# Patient Record
Sex: Female | Born: 1945 | Race: White | Hispanic: No | Smoking: Former smoker
Health system: Southern US, Community
[De-identification: ages and names within clinical notes are randomized; demographics above are authoritative.]

## PROBLEM LIST (undated history)

## (undated) DIAGNOSIS — D649 Anemia, unspecified: Secondary | ICD-10-CM

## (undated) DIAGNOSIS — G4730 Sleep apnea, unspecified: Secondary | ICD-10-CM

## (undated) DIAGNOSIS — I499 Cardiac arrhythmia, unspecified: Secondary | ICD-10-CM

## (undated) DIAGNOSIS — G5620 Lesion of ulnar nerve, unspecified upper limb: Secondary | ICD-10-CM

## (undated) DIAGNOSIS — Z923 Personal history of irradiation: Secondary | ICD-10-CM

## (undated) DIAGNOSIS — R928 Other abnormal and inconclusive findings on diagnostic imaging of breast: Secondary | ICD-10-CM

## (undated) DIAGNOSIS — J449 Chronic obstructive pulmonary disease, unspecified: Secondary | ICD-10-CM

## (undated) DIAGNOSIS — F329 Major depressive disorder, single episode, unspecified: Secondary | ICD-10-CM

## (undated) DIAGNOSIS — E538 Deficiency of other specified B group vitamins: Secondary | ICD-10-CM

## (undated) DIAGNOSIS — C50919 Malignant neoplasm of unspecified site of unspecified female breast: Secondary | ICD-10-CM

## (undated) DIAGNOSIS — M545 Low back pain: Secondary | ICD-10-CM

## (undated) DIAGNOSIS — G8929 Other chronic pain: Secondary | ICD-10-CM

## (undated) DIAGNOSIS — G4700 Insomnia, unspecified: Secondary | ICD-10-CM

## (undated) DIAGNOSIS — E669 Obesity, unspecified: Secondary | ICD-10-CM

## (undated) DIAGNOSIS — I10 Essential (primary) hypertension: Secondary | ICD-10-CM

## (undated) DIAGNOSIS — E785 Hyperlipidemia, unspecified: Secondary | ICD-10-CM

## (undated) DIAGNOSIS — K219 Gastro-esophageal reflux disease without esophagitis: Secondary | ICD-10-CM

## (undated) DIAGNOSIS — K8020 Calculus of gallbladder without cholecystitis without obstruction: Secondary | ICD-10-CM

## (undated) DIAGNOSIS — E119 Type 2 diabetes mellitus without complications: Secondary | ICD-10-CM

## (undated) HISTORY — PX: BACK SURGERY: SHX140

## (undated) HISTORY — DX: Other chronic pain: G89.29

## (undated) HISTORY — DX: Major depressive disorder, single episode, unspecified: F32.9

## (undated) HISTORY — DX: Obesity, unspecified: E66.9

## (undated) HISTORY — DX: Deficiency of other specified B group vitamins: E53.8

## (undated) HISTORY — DX: Insomnia, unspecified: G47.00

## (undated) HISTORY — DX: Type 2 diabetes mellitus without complications: E11.9

## (undated) HISTORY — PX: ULNAR NERVE REPAIR: SHX2594

## (undated) HISTORY — PX: ABDOMINAL HYSTERECTOMY: SHX81

## (undated) HISTORY — PX: CARPAL TUNNEL RELEASE: SHX101

## (undated) HISTORY — PX: NECK SURGERY: SHX720

## (undated) HISTORY — DX: Other abnormal and inconclusive findings on diagnostic imaging of breast: R92.8

## (undated) HISTORY — DX: Essential (primary) hypertension: I10

## (undated) HISTORY — DX: Gastro-esophageal reflux disease without esophagitis: K21.9

## (undated) HISTORY — DX: Calculus of gallbladder without cholecystitis without obstruction: K80.20

## (undated) HISTORY — DX: Hyperlipidemia, unspecified: E78.5

## (undated) HISTORY — DX: Sleep apnea, unspecified: G47.30

## (undated) HISTORY — DX: Low back pain: M54.5

## (undated) HISTORY — PX: KNEE SURGERY: SHX244

## (undated) HISTORY — DX: Lesion of ulnar nerve, unspecified upper limb: G56.20

---

## 2005-03-12 ENCOUNTER — Ambulatory Visit: Payer: Self-pay | Admitting: Orthopaedic Surgery

## 2005-03-12 ENCOUNTER — Other Ambulatory Visit: Payer: Self-pay

## 2005-03-15 ENCOUNTER — Ambulatory Visit: Payer: Self-pay | Admitting: Orthopaedic Surgery

## 2006-11-28 ENCOUNTER — Ambulatory Visit: Payer: Self-pay | Admitting: Family Medicine

## 2007-04-13 ENCOUNTER — Ambulatory Visit: Payer: Self-pay | Admitting: Unknown Physician Specialty

## 2009-05-29 ENCOUNTER — Ambulatory Visit: Payer: Self-pay | Admitting: Family Medicine

## 2009-06-05 ENCOUNTER — Ambulatory Visit: Payer: Self-pay | Admitting: Family Medicine

## 2009-06-17 ENCOUNTER — Ambulatory Visit: Payer: Self-pay | Admitting: Surgery

## 2009-06-17 ENCOUNTER — Ambulatory Visit: Payer: Self-pay | Admitting: Internal Medicine

## 2009-06-20 ENCOUNTER — Ambulatory Visit: Payer: Self-pay | Admitting: Surgery

## 2009-06-20 HISTORY — PX: BREAST EXCISIONAL BIOPSY: SUR124

## 2009-09-11 ENCOUNTER — Ambulatory Visit: Payer: Self-pay | Admitting: Orthopedic Surgery

## 2009-09-16 ENCOUNTER — Ambulatory Visit: Payer: Self-pay | Admitting: Orthopedic Surgery

## 2010-07-15 ENCOUNTER — Ambulatory Visit: Payer: Self-pay | Admitting: Family Medicine

## 2010-08-07 ENCOUNTER — Ambulatory Visit: Payer: Self-pay | Admitting: Surgery

## 2010-08-14 ENCOUNTER — Ambulatory Visit: Payer: Self-pay | Admitting: Surgery

## 2010-08-14 HISTORY — PX: LAPAROSCOPIC CHOLECYSTECTOMY: SUR755

## 2010-08-18 LAB — PATHOLOGY REPORT

## 2011-04-22 ENCOUNTER — Ambulatory Visit: Payer: Self-pay | Admitting: Family Medicine

## 2011-04-28 ENCOUNTER — Ambulatory Visit: Payer: Self-pay | Admitting: Internal Medicine

## 2011-05-29 ENCOUNTER — Ambulatory Visit: Payer: Self-pay | Admitting: Internal Medicine

## 2011-08-12 ENCOUNTER — Ambulatory Visit: Payer: Self-pay | Admitting: Internal Medicine

## 2011-08-28 ENCOUNTER — Ambulatory Visit: Payer: Self-pay | Admitting: Internal Medicine

## 2012-04-01 ENCOUNTER — Emergency Department: Payer: Self-pay | Admitting: Emergency Medicine

## 2012-04-05 ENCOUNTER — Ambulatory Visit: Payer: Self-pay | Admitting: Orthopedic Surgery

## 2012-04-05 LAB — CBC
HGB: 13.2 g/dL (ref 12.0–16.0)
MCH: 31.9 pg (ref 26.0–34.0)
Platelet: 212 10*3/uL (ref 150–440)

## 2012-04-05 LAB — URINALYSIS, COMPLETE
Bilirubin,UR: NEGATIVE
Blood: NEGATIVE
Glucose,UR: NEGATIVE mg/dL (ref 0–75)
Leukocyte Esterase: NEGATIVE
Nitrite: NEGATIVE
Ph: 5 (ref 4.5–8.0)
Protein: NEGATIVE
RBC,UR: 2 /HPF (ref 0–5)

## 2012-04-05 LAB — BASIC METABOLIC PANEL
Anion Gap: 8 (ref 7–16)
BUN: 21 mg/dL — ABNORMAL HIGH (ref 7–18)
Co2: 28 mmol/L (ref 21–32)
Creatinine: 0.71 mg/dL (ref 0.60–1.30)
EGFR (African American): >60
EGFR (Non-African Amer.): >60
Glucose: 100 mg/dL — ABNORMAL HIGH (ref 65–99)
Osmolality: 288 (ref 275–301)

## 2012-04-05 LAB — PROTIME-INR
INR: 1.2
Prothrombin Time: 15.4 secs — ABNORMAL HIGH (ref 11.5–14.7)

## 2012-04-07 ENCOUNTER — Ambulatory Visit: Payer: Self-pay | Admitting: Orthopedic Surgery

## 2012-11-14 ENCOUNTER — Ambulatory Visit: Payer: Self-pay | Admitting: Unknown Physician Specialty

## 2012-11-21 ENCOUNTER — Ambulatory Visit: Payer: Self-pay | Admitting: Internal Medicine

## 2012-11-21 DIAGNOSIS — R928 Other abnormal and inconclusive findings on diagnostic imaging of breast: Secondary | ICD-10-CM

## 2012-11-21 HISTORY — DX: Other abnormal and inconclusive findings on diagnostic imaging of breast: R92.8

## 2012-11-23 ENCOUNTER — Ambulatory Visit: Payer: Self-pay | Admitting: Internal Medicine

## 2012-12-11 ENCOUNTER — Ambulatory Visit: Payer: Self-pay | Admitting: Surgery

## 2012-12-11 HISTORY — PX: BREAST BIOPSY: SHX20

## 2012-12-13 LAB — PATHOLOGY REPORT

## 2013-07-24 ENCOUNTER — Ambulatory Visit: Payer: Self-pay | Admitting: Surgery

## 2013-08-18 ENCOUNTER — Emergency Department: Payer: Self-pay | Admitting: Emergency Medicine

## 2013-08-18 ENCOUNTER — Ambulatory Visit: Payer: Self-pay | Admitting: Orthopedic Surgery

## 2014-03-31 DIAGNOSIS — F32A Depression, unspecified: Secondary | ICD-10-CM | POA: Insufficient documentation

## 2014-03-31 DIAGNOSIS — G4730 Sleep apnea, unspecified: Secondary | ICD-10-CM

## 2014-03-31 DIAGNOSIS — I10 Essential (primary) hypertension: Secondary | ICD-10-CM

## 2014-03-31 DIAGNOSIS — E785 Hyperlipidemia, unspecified: Secondary | ICD-10-CM

## 2014-03-31 DIAGNOSIS — K219 Gastro-esophageal reflux disease without esophagitis: Secondary | ICD-10-CM

## 2014-03-31 DIAGNOSIS — F329 Major depressive disorder, single episode, unspecified: Secondary | ICD-10-CM | POA: Insufficient documentation

## 2014-03-31 DIAGNOSIS — E119 Type 2 diabetes mellitus without complications: Secondary | ICD-10-CM | POA: Insufficient documentation

## 2014-03-31 DIAGNOSIS — D72829 Elevated white blood cell count, unspecified: Secondary | ICD-10-CM | POA: Insufficient documentation

## 2014-03-31 HISTORY — DX: Sleep apnea, unspecified: G47.30

## 2014-03-31 HISTORY — DX: Depression, unspecified: F32.A

## 2014-03-31 HISTORY — DX: Type 2 diabetes mellitus without complications: E11.9

## 2014-03-31 HISTORY — DX: Essential (primary) hypertension: I10

## 2014-03-31 HISTORY — DX: Hyperlipidemia, unspecified: E78.5

## 2014-03-31 HISTORY — DX: Gastro-esophageal reflux disease without esophagitis: K21.9

## 2014-04-01 DIAGNOSIS — E538 Deficiency of other specified B group vitamins: Secondary | ICD-10-CM | POA: Insufficient documentation

## 2014-04-01 DIAGNOSIS — G4700 Insomnia, unspecified: Secondary | ICD-10-CM | POA: Insufficient documentation

## 2014-04-01 HISTORY — DX: Deficiency of other specified B group vitamins: E53.8

## 2014-04-01 HISTORY — DX: Insomnia, unspecified: G47.00

## 2015-01-17 NOTE — Consult Note (Signed)
PATIENT NAME:  Robin Huff, Robin Huff MR#:  [id number] DATE OF BIRTH:  1946/04/05  EMERGENCY ROOM CONSULTATION  DATE OF CONSULTATION:  08/18/2013  CONSULTING PHYSICIAN:  Claud Kelp, MD  HISTORY OF PRESENT ILLNESS: Ms. Yearick is a 69 year old female with multiple medical problems to include insulin-dependent diabetes, hypertension, who sustained a fall earlier this evening on her outstretched left hand with immediate pain and deformity about her left wrist. She was brought to the Emergency Room for further evaluation.   PAST MEDICAL HISTORY: As above.  SOCIAL HISTORY:  Positive for tobacco use.   REVIEW OF SYSTEMS: Noncontributory.   PHYSICAL EXAMINATION: The patient is a right-hand dominant female who has in a volar splint in place upon initial examination to her left wrist. She was distally neurovascularly intact with intact sensation in all her digits, as well as brisk capillary refill  in all digits to that left hand, Upon removal of her splint and inspection of her skin, she did have some superficial skin sloughing on the dorsum of her hand from her previous attempted reduction in the ER, and again,  notable dorsal deformity. The patient had no tenderness to palpation about the elbow, arm or shoulder. Radiographs taken of the left wrist demonstrate a comminuted fracture of the distal radius and ulna with dorsal angulation and comminution.   ASSESSMENT: A 69 year old female with a comminuted distal radius fracture.   PLAN: The patient was counseled and consented for a closed reduction with splint immobilization to her left wrist. It was explained to her that these are often quite unstable injuries and may go on to require surgical stabilization. She was counseled that she will need serial radiographs weekly for the next few weeks to confirm maintenance of her reduction maneuver. She consented and closed reduction was performed. Postreduction radiographs demonstrated improved alignment and  reduction of her distal radius fracture. She will be discharged home in a sling for comfort. Follow up in one week for repeat radiographic evaluation.     ____________________________ Claud Kelp, MD tte:NTS D: 08/18/2013 03:23:08 ET T: 08/18/2013 04:22:40 ET JOB#: [id number]  cc: Claud Kelp, MD, <Dictator> Claud Kelp MD ELECTRONICALLY SIGNED 08/19/2013 7:55

## 2015-01-19 NOTE — Consult Note (Signed)
Brief Consult Note: Diagnosis: Left knee patella fracture, closed.   Patient was seen by consultant.   Recommend further assessment or treatment.   Discussed with Attending MD.   Comments: Case discussed with Dr. Jimmye Norman from ER.  69 y/o female presents for left knee pain and swelling after a fall.  She said her right knee slipped out from under her and the left knee impacted the ground in a flexed position.  She is seen in the ED with her husband.  On exam of the left knee, the skin is intact.  There is an effusion.  She has no erythema or ecchymosis.  Patient has intact sensation to light touch in the lower extremity, intact motor function of the lower leg muscles and palpable pedal pulses.  She has tenderness over the patella.  On x-ray she has a fracture of the lower pole of the patella with 3-4 cm of distraction at the fracture site.  The fracture appears to be in 2 parts, without significant comminution.  I explained to the patient that she can be discharged home tonight with a knee immobilizer and a walker.  She should ice and elevate the lower extremity.  She is not to stand or ambulate without the knee immobilizer on and with her walker.  She will call the office on Monday for an appointment and she will have surgical fixation of this fracture later this week. Patient and her husband understood and agreed with this plan.  Electronic Signatures: Thornton Park (MD)  (Signed 06-Jul-13 23:43)  Authored: Brief Consult Note   Last Updated: 06-Jul-13 23:43 by Thornton Park (MD)

## 2015-05-02 ENCOUNTER — Other Ambulatory Visit: Payer: Self-pay | Admitting: Internal Medicine

## 2015-05-02 DIAGNOSIS — Z1231 Encounter for screening mammogram for malignant neoplasm of breast: Secondary | ICD-10-CM

## 2015-05-02 DIAGNOSIS — M545 Low back pain, unspecified: Secondary | ICD-10-CM

## 2015-05-02 DIAGNOSIS — G8929 Other chronic pain: Secondary | ICD-10-CM

## 2015-05-02 DIAGNOSIS — Z79899 Other long term (current) drug therapy: Secondary | ICD-10-CM | POA: Insufficient documentation

## 2015-05-02 HISTORY — DX: Low back pain, unspecified: M54.50

## 2015-05-02 HISTORY — DX: Other chronic pain: G89.29

## 2015-05-14 ENCOUNTER — Ambulatory Visit
Admission: RE | Admit: 2015-05-14 | Discharge: 2015-05-14 | Disposition: A | Discharge status: Home / Self Care | Payer: Medicare Other | Source: Ambulatory Visit | Attending: Internal Medicine | Admitting: Internal Medicine

## 2015-05-14 DIAGNOSIS — R922 Inconclusive mammogram: Secondary | ICD-10-CM | POA: Insufficient documentation

## 2015-05-14 DIAGNOSIS — Z1231 Encounter for screening mammogram for malignant neoplasm of breast: Secondary | ICD-10-CM | POA: Insufficient documentation

## 2015-05-16 ENCOUNTER — Other Ambulatory Visit: Payer: Self-pay | Admitting: Internal Medicine

## 2015-05-16 DIAGNOSIS — R928 Other abnormal and inconclusive findings on diagnostic imaging of breast: Secondary | ICD-10-CM

## 2015-05-16 DIAGNOSIS — N63 Unspecified lump in unspecified breast: Secondary | ICD-10-CM

## 2015-05-19 ENCOUNTER — Telehealth: Payer: Self-pay | Admitting: Surgery

## 2015-05-19 NOTE — Telephone Encounter (Signed)
Question about mammogram and appointment

## 2015-05-20 NOTE — Telephone Encounter (Signed)
Returned patient call. Patient wanted to know if she should go to an additional breast imaging appointment. I informed patient that Dr Dorthula Perfect ordered the original scan and that radiology recommended additional imaging to verify a possible breast mass. If the scans show that she does have a breast mass then [facility] surgical will be contacted to perform a biopsy. Patient confirmed understanding of information and will keep her next imaging appointment.

## 2015-05-22 ENCOUNTER — Ambulatory Visit
Admission: RE | Admit: 2015-05-22 | Discharge: 2015-05-22 | Disposition: A | Discharge status: Home / Self Care | Payer: Medicare Other | Source: Ambulatory Visit | Attending: Internal Medicine | Admitting: Internal Medicine

## 2015-05-22 DIAGNOSIS — N63 Unspecified lump in unspecified breast: Secondary | ICD-10-CM

## 2015-05-22 DIAGNOSIS — R928 Other abnormal and inconclusive findings on diagnostic imaging of breast: Secondary | ICD-10-CM

## 2015-05-27 DIAGNOSIS — G5620 Lesion of ulnar nerve, unspecified upper limb: Secondary | ICD-10-CM

## 2015-05-27 DIAGNOSIS — E669 Obesity, unspecified: Secondary | ICD-10-CM | POA: Insufficient documentation

## 2015-05-27 DIAGNOSIS — K8020 Calculus of gallbladder without cholecystitis without obstruction: Secondary | ICD-10-CM | POA: Insufficient documentation

## 2015-05-27 HISTORY — DX: Calculus of gallbladder without cholecystitis without obstruction: K80.20

## 2015-05-27 HISTORY — DX: Obesity, unspecified: E66.9

## 2015-05-27 HISTORY — DX: Lesion of ulnar nerve, unspecified upper limb: G56.20

## 2015-05-29 ENCOUNTER — Ambulatory Visit: Payer: Self-pay | Admitting: Surgery

## 2015-05-29 ENCOUNTER — Ambulatory Visit (INDEPENDENT_AMBULATORY_CARE_PROVIDER_SITE_OTHER): Payer: Medicare Other | Admitting: Surgery

## 2015-05-29 ENCOUNTER — Telehealth: Payer: Self-pay | Admitting: Surgery

## 2015-05-29 ENCOUNTER — Encounter: Payer: Self-pay | Admitting: Surgery

## 2015-05-29 VITALS — BP 172/71 | HR 71 | Temp 98.1°F | Ht 65.0 in | Wt 237.0 lb

## 2015-05-29 DIAGNOSIS — N63 Unspecified lump in unspecified breast: Secondary | ICD-10-CM

## 2015-05-29 NOTE — Progress Notes (Signed)
Surgical Consultation  05/29/2015  Robin Huff is an 69 y.o. female.   Chief Complaint  Patient presents with  . Follow-up    mammogram results     HPI: She is referred to discuss a newly discovered lesion in her left breast. She has had 2 previous breast lesions both of which were benign. She's not had any recent breast symptoms. A 1 x 1.2 cm lesion was identified in the left breast at the 12:00 position on mammography and tomography. Ultrasound confirmed the lesion. She's had no other new breast symptoms. She's not had any nipple drainage or breast pain. She has no other change in her health at the present time. She continues to smoke.  Past Medical History  Diagnosis Date  . Cholelithiasis 05/27/2015    Requiring Cholecystectomy 08/14/2014- Dr. Pat Patrick   . BP (high blood pressure) 03/31/2014  . B12 deficiency 04/01/2014  . Acid reflux 03/31/2014  . Diabetes mellitus, type 2 03/31/2014  . Insomnia, persistent 04/01/2014  . 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 Abnormal finding on mammography 11/21/2012    Overview:  Biopsied 11/25/2012 by Dr. Pat Patrick; was due for screening mammogram February 2015.   Marland Kitchen Chronic LBP 05/02/2015  . Clinical depression 03/31/2014    Overview:  S/P suicide attempt 02/1995   . HLD (hyperlipidemia) 03/31/2014  . Apnea, sleep 03/31/2014    Overview:  on CPAP   . Obesity 05/27/2015    Past Surgical History  Procedure Laterality Date  . Abdominal hysterectomy      Total  . Breast biopsy Right 06/20/09    neg  . Breast biopsy Left 12/11/12    neg  . Laparoscopic cholecystectomy  08/14/2010    Dr. Pat Patrick  . Back surgery      Multiple  . Neck surgery      Multiple  . Knee surgery Left     Family History  Problem Relation Age of Onset  . Hypertension    . Stroke    . Diabetes    . Heart disease    . Hypertension Mother   . Diabetes Brother     Social History:  reports that she has been smoking Cigarettes.  She has been smoking about 1.00 pack per day. She has never  used smokeless tobacco. She reports that she does not drink alcohol or use illicit drugs.  Allergies:  Allergies  Allergen Reactions  . Chlorthalidone Other (See Comments)  . Ciprofloxacin Swelling    Swelling of hands and feet  . Codeine Nausea Only  . Diclofenac     Other reaction(s): Abdominal Pain  . Hydrochlorothiazide     Other reaction(s): Other (See Comments) Leg cramps    Medications reviewed.     Review of Systems  Constitutional: Positive for malaise/fatigue. Negative for fever.  HENT: Negative.   Eyes: Positive for blurred vision.       Recent eye surgery  Respiratory: Negative.   Cardiovascular: Negative.   Gastrointestinal: Negative.   Genitourinary: Negative.   Musculoskeletal: Negative.   Skin: Negative.   Neurological: Positive for weakness.  Psychiatric/Behavioral: Negative.        BP 172/71 mmHg  Pulse 71  Temp(Src) 98.1 F (36.7 C) (Oral)  Ht 5\' 5"  (1.651 m)  Wt 237 lb (107.502 kg)  BMI 39.44 kg/m2  Physical Exam  Constitutional: She is well-developed, well-nourished, and in no distress. No distress.  I cannot palpate a lesion in either breast.  No axillary adenopathy noted.  HENT:  Head:  Normocephalic and atraumatic.  Eyes: Pupils are equal, round, and reactive to light.  Neck: Normal range of motion. Neck supple.  Cardiovascular: Regular rhythm and normal heart sounds.   Pulmonary/Chest: Effort normal and breath sounds normal.  Abdominal: Soft. Bowel sounds are normal.  Musculoskeletal:  Decreased range of motion left leg  Neurological: She is alert.  Gait compromised by left knee problems  Skin: Skin is warm and dry.  Psychiatric: Affect and judgment normal.      No results found for this or any previous visit (from the past 48 hour(s)). No results found.  Assessment/Plan: 1. Breast mass in female I have reviewed her films and her reports. I cannot palpate an abnormality. In this setting I would recommend a percutaneous  ultrasound-guided biopsy. This plans been discussed with her in detail and she is in agreement. We will set something up in the near future.   Dia Crawford III dermatitis

## 2015-05-29 NOTE — Patient Instructions (Signed)
Your left breast biopsy has been scheduled on 06/10/15 at [facility].

## 2015-05-29 NOTE — Telephone Encounter (Signed)
Pt advised of pre op date/time and sx date. Sx: 06/10/15 with Dr Ely--Left breast biopsy ultrasound guided.  Pre op: 06/04/15 @ 11:15am--Office.

## 2015-05-30 ENCOUNTER — Other Ambulatory Visit: Payer: Self-pay | Admitting: Surgery

## 2015-05-30 DIAGNOSIS — R928 Other abnormal and inconclusive findings on diagnostic imaging of breast: Secondary | ICD-10-CM

## 2015-05-30 DIAGNOSIS — N63 Unspecified lump in unspecified breast: Secondary | ICD-10-CM

## 2015-06-03 ENCOUNTER — Ambulatory Visit: Payer: Medicare Other

## 2015-06-04 ENCOUNTER — Ambulatory Visit
Admission: RE | Admit: 2015-06-04 | Discharge: 2015-06-04 | Disposition: A | Discharge status: Home / Self Care | Payer: Medicare Other | Source: Ambulatory Visit | Attending: Surgery | Admitting: Surgery

## 2015-06-04 ENCOUNTER — Other Ambulatory Visit: Payer: Medicare Other

## 2015-06-04 DIAGNOSIS — R928 Other abnormal and inconclusive findings on diagnostic imaging of breast: Secondary | ICD-10-CM

## 2015-06-04 DIAGNOSIS — N63 Unspecified lump in unspecified breast: Secondary | ICD-10-CM

## 2015-06-04 HISTORY — PX: BREAST BIOPSY: SHX20

## 2015-06-05 ENCOUNTER — Telehealth: Payer: Self-pay

## 2015-06-05 NOTE — Telephone Encounter (Signed)
Dr. Reuel Derby called at this time and informed me that pathology for patient's Left Breast Biopsy at 12 O'Clock was positive for Invasive Mammary Carcinoma. Dr. Pat Patrick notified at this time.

## 2015-06-05 NOTE — Telephone Encounter (Signed)
Spoke with Vita Barley (Nurse Navigator) at this time and patient is scheduled to see Dr. Maryjane Hurter on Tuesday 9/13 at [facility]. Nurse navigator will be present at appointment to establish rapport.

## 2015-06-06 NOTE — Telephone Encounter (Signed)
After speaking with Dr. Pat Patrick, we will see patient in office on Monday (9/12) at 3:15pm to review pathology results and we will give patient [facility] appointment information at that time.

## 2015-06-09 ENCOUNTER — Ambulatory Visit (INDEPENDENT_AMBULATORY_CARE_PROVIDER_SITE_OTHER): Payer: Medicare Other | Admitting: Surgery

## 2015-06-09 ENCOUNTER — Encounter: Payer: Self-pay | Admitting: Surgery

## 2015-06-09 ENCOUNTER — Telehealth: Payer: Self-pay

## 2015-06-09 VITALS — BP 155/89 | HR 87 | Temp 97.8°F | Ht 65.0 in | Wt 237.0 lb

## 2015-06-09 DIAGNOSIS — C50412 Malignant neoplasm of upper-outer quadrant of left female breast: Secondary | ICD-10-CM | POA: Diagnosis not present

## 2015-06-09 LAB — SURGICAL PATHOLOGY

## 2015-06-09 NOTE — Progress Notes (Signed)
Outpatient Surgical Follow Up  06/09/2015  Robin Huff is an 69 y.o. female.   Chief Complaint  Patient presents with  . Follow-up    go over left Breast Biopsy    HPI: She returns following her biopsy. Ultrasound-guided needle biopsy did reveal mammary carcinomaspecific type. Hormone studies are pending. She did not have any significant complications.  Past Medical History  Diagnosis Date  . Cholelithiasis 05/27/2015    Requiring Cholecystectomy 08/14/2014- Dr. Pat Patrick   . BP (high blood pressure) 03/31/2014  . B12 deficiency 04/01/2014  . Acid reflux 03/31/2014  . Diabetes mellitus, type 2 03/31/2014  . Insomnia, persistent 04/01/2014  . 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 Abnormal finding on mammography 11/21/2012    Overview:  Biopsied 11/25/2012 by Dr. Pat Patrick; was due for screening mammogram February 2015.   Marland Kitchen Chronic LBP 05/02/2015  . Clinical depression 03/31/2014    Overview:  S/P suicide attempt 02/1995   . HLD (hyperlipidemia) 03/31/2014  . Apnea, sleep 03/31/2014    Overview:  on CPAP   . Obesity 05/27/2015    Past Surgical History  Procedure Laterality Date  . Abdominal hysterectomy      Total  . Laparoscopic cholecystectomy  08/14/2010    Dr. Pat Patrick  . Back surgery      Multiple  . Neck surgery      Multiple  . Knee surgery Left   . Breast biopsy Right 06/20/09    neg  . Breast biopsy Left 12/11/12    neg  . Breast biopsy Left 06/04/2015    Family History  Problem Relation Age of Onset  . Hypertension    . Stroke    . Diabetes    . Heart disease    . Hypertension Mother   . Diabetes Brother     Social History:  reports that she has been smoking Cigarettes.  She has been smoking about 1.00 pack per day. She has never used smokeless tobacco. She reports that she does not drink alcohol or use illicit drugs.  Allergies:  Allergies  Allergen Reactions  . Chlorthalidone Other (See Comments)  . Ciprofloxacin Swelling    Swelling of hands and feet  . Codeine Nausea Only  .  Diclofenac     Other reaction(s): Abdominal Pain  . Hydrochlorothiazide     Other reaction(s): Other (See Comments) Leg cramps    Medications reviewed.    ROS    BP 155/89 mmHg  Pulse 87  Temp(Src) 97.8 F (36.6 C) (Oral)  Ht 5\' 5"  (1.651 m)  Wt 237 lb (107.502 kg)  BMI 39.44 kg/m2  Physical Exam no physical examination was performed today.     No results found for this or any previous visit (from the past 48 hour(s)). No results found.  Assessment/Plan:  1. Malignant neoplasm of upper outer quadrant of female breast, left We discussed the options available to her at length. We talked about mastectomy versus partial mastectomy and radiation. We have recommended she consider an oncology evaluation for treatment planning as we await the studies from her hormone receptors. She is in agreement with this plan. We will set her up for oncology appointment.     Dia Crawford III  06/09/2015,negative

## 2015-06-09 NOTE — Patient Instructions (Signed)
We will arrange to have you seen in the [place] in [facility] and in the afternoon per your preference. We will call you with the details of this appointment.

## 2015-06-09 NOTE — Telephone Encounter (Signed)
Patient was to have appointment in [facility] tomorrow but I needed to cancel this during patient's office appointment today.  Call made to [facility] to reschedule appointment. Pt would like to wait until next week as her twin brother is having back surgery and she needs to be with him. She prefers the [place] office in the afternoon. All of this information was left on voicemail for Collette to call back with appointment information. I will inform patient as soon as this information is available.

## 2015-06-09 NOTE — Telephone Encounter (Signed)
Called patient to let her know that we would like to see her in office to review her pathology. Left voicemail on home phone.  Call made to Emergency Contact who took the phone to the patient. She was informed of appointment today. Verifies that she will be here. Placed on schedule for 3:15pm.

## 2015-06-10 ENCOUNTER — Ambulatory Visit: Admit: 2015-06-10 | Payer: Medicare Other | Admitting: Surgery

## 2015-06-10 ENCOUNTER — Inpatient Hospital Stay: Payer: Medicare Other | Admitting: Oncology

## 2015-06-10 SURGERY — BREAST BIOPSY
Anesthesia: Choice | Laterality: Left

## 2015-06-11 NOTE — Telephone Encounter (Signed)
Call made to [facility] at this time to reschedule appointment at this time. No answer. Left voicemail for return phone call.

## 2015-06-12 NOTE — Telephone Encounter (Signed)
Called once again to [facility], spoke with Webb Silversmith. She will get this appointment scheduled for patient and let me know as soon as this is made.  Will await return phone call.

## 2015-06-13 ENCOUNTER — Telehealth: Payer: Self-pay

## 2015-06-13 NOTE — Telephone Encounter (Signed)
Webb Silversmith Psychologist, prison and probation services) called from [facility] at this time with following appointment:  06/20/15 at 0830am in [place] with Dr. Nena Alexander appointment was not available.  Called patient to give her the above appointment information. Explained Nurse Navigator program and that Webb Silversmith would be calling her to go over her family history information. She is excited about the Nurse Navigator program after I explained this to her. Pt readback all information regarding appointment and directions were given to [facility].

## 2015-06-13 NOTE — Telephone Encounter (Signed)
Patient scheduled for appointment with Dr. Grayland Ormond in [facility] on Friday, September 23 at 0830.  Notified Museum/gallery conservator at [facility].  Phoned patient to introduce Navigation service. No answer.  Left message.

## 2015-06-20 ENCOUNTER — Encounter: Payer: Self-pay | Admitting: *Deleted

## 2015-06-20 ENCOUNTER — Inpatient Hospital Stay: Payer: Medicare Other | Attending: Oncology | Admitting: Oncology

## 2015-06-20 VITALS — BP 164/77 | HR 63 | Temp 98.0°F | Ht 66.54 in | Wt 236.4 lb

## 2015-06-20 DIAGNOSIS — Z170 Estrogen receptor positive status [ER+]: Secondary | ICD-10-CM | POA: Diagnosis not present

## 2015-06-20 DIAGNOSIS — Z794 Long term (current) use of insulin: Secondary | ICD-10-CM | POA: Insufficient documentation

## 2015-06-20 DIAGNOSIS — Z9049 Acquired absence of other specified parts of digestive tract: Secondary | ICD-10-CM | POA: Diagnosis not present

## 2015-06-20 DIAGNOSIS — F329 Major depressive disorder, single episode, unspecified: Secondary | ICD-10-CM | POA: Insufficient documentation

## 2015-06-20 DIAGNOSIS — E119 Type 2 diabetes mellitus without complications: Secondary | ICD-10-CM | POA: Insufficient documentation

## 2015-06-20 DIAGNOSIS — Z7982 Long term (current) use of aspirin: Secondary | ICD-10-CM | POA: Diagnosis not present

## 2015-06-20 DIAGNOSIS — E785 Hyperlipidemia, unspecified: Secondary | ICD-10-CM | POA: Diagnosis not present

## 2015-06-20 DIAGNOSIS — G8929 Other chronic pain: Secondary | ICD-10-CM | POA: Insufficient documentation

## 2015-06-20 DIAGNOSIS — E669 Obesity, unspecified: Secondary | ICD-10-CM | POA: Insufficient documentation

## 2015-06-20 DIAGNOSIS — M899 Disorder of bone, unspecified: Secondary | ICD-10-CM | POA: Diagnosis not present

## 2015-06-20 DIAGNOSIS — I10 Essential (primary) hypertension: Secondary | ICD-10-CM | POA: Insufficient documentation

## 2015-06-20 DIAGNOSIS — M545 Low back pain: Secondary | ICD-10-CM | POA: Insufficient documentation

## 2015-06-20 DIAGNOSIS — E538 Deficiency of other specified B group vitamins: Secondary | ICD-10-CM

## 2015-06-20 DIAGNOSIS — F17210 Nicotine dependence, cigarettes, uncomplicated: Secondary | ICD-10-CM | POA: Diagnosis not present

## 2015-06-20 DIAGNOSIS — C50912 Malignant neoplasm of unspecified site of left female breast: Secondary | ICD-10-CM | POA: Diagnosis not present

## 2015-06-20 DIAGNOSIS — Z79899 Other long term (current) drug therapy: Secondary | ICD-10-CM | POA: Diagnosis not present

## 2015-06-20 DIAGNOSIS — K219 Gastro-esophageal reflux disease without esophagitis: Secondary | ICD-10-CM | POA: Diagnosis not present

## 2015-06-20 DIAGNOSIS — G4730 Sleep apnea, unspecified: Secondary | ICD-10-CM | POA: Diagnosis not present

## 2015-06-20 DIAGNOSIS — Z915 Personal history of self-harm: Secondary | ICD-10-CM | POA: Diagnosis not present

## 2015-06-20 DIAGNOSIS — G4700 Insomnia, unspecified: Secondary | ICD-10-CM | POA: Diagnosis not present

## 2015-06-20 NOTE — Progress Notes (Signed)
New patient with a newly diagnosed L Breast cancer. Denies pain at biopsy site. Appetite is good. Energy is fair. Has chronic back pain. Has no knee cap on L knee so she uses a cane. Reports normal B/B.

## 2015-06-20 NOTE — Progress Notes (Signed)
Oncology Nurse Navigator Documentation  Oncology Nurse Navigator Flowsheets 06/20/2015  Referral date to RadOnc/MedOnc 06/20/2015  Navigator Encounter Type Initial MedOnc  Patient Visit Type Medonc  Barriers/Navigation Needs No barriers at this time  Time Spent with Patient 75   Met patient today during her initial medical oncology visit with Dr. Grayland Ormond.  Gave patient breast cancer educational literature, "My Breast Cancer Treatment Handbook" by Josephine Igo, RN.  Offered support.  She is to call if she has any questions or needs.  Called Dr. Rolin Barry office to get her back in to see him for her lumpectomy and sentinel node biopsy.  Sharyn Lull, from Dr. Rolin Barry office called back to inform me that she had talked with Dr. Pat Patrick and the patient has an appointment to meet with Dr. Azalee Course on October 3rd for pre-op.

## 2015-06-28 DIAGNOSIS — C50919 Malignant neoplasm of unspecified site of unspecified female breast: Secondary | ICD-10-CM

## 2015-06-28 HISTORY — DX: Malignant neoplasm of unspecified site of unspecified female breast: C50.919

## 2015-06-30 ENCOUNTER — Encounter: Payer: Self-pay | Admitting: Surgery

## 2015-06-30 ENCOUNTER — Ambulatory Visit (INDEPENDENT_AMBULATORY_CARE_PROVIDER_SITE_OTHER): Payer: Medicare Other | Admitting: Surgery

## 2015-06-30 VITALS — BP 168/76 | HR 73 | Temp 98.4°F

## 2015-06-30 DIAGNOSIS — C50912 Malignant neoplasm of unspecified site of left female breast: Secondary | ICD-10-CM | POA: Diagnosis not present

## 2015-06-30 NOTE — Patient Instructions (Signed)
Our surgery scheduler will be in contact with you concerning your surgery date and time. Please call our office if you have any questions.

## 2015-06-30 NOTE — Progress Notes (Signed)
Subjective:     Patient ID: Robin Huff, female   DOB: [birth year], 69 y.o.   MRN: [id number]  HPI  69 yr old female with PMHx of well controlled HTN with two medications, DM type 2 well controlled with Novolog and history of multiple breast biopsies.  Patient comes in today after having an abnormal mammogram, US guided breast biopsy and result of left breast 12'oclock, 1cm invasive ductal carcinoma with ER+, PR+, Her 2 -.  Patient denies any fever, chills, pain, feeling lumps under axilla or family history of breast cancers.  She has had needle biopsies and one surgical biopsy of right breast She comes in today to discuss surgical options.   Past Medical History  Diagnosis Date  . Cholelithiasis 05/27/2015    Requiring Cholecystectomy 08/14/2014- Dr. Pat Patrick   . BP (high blood pressure) 03/31/2014  . B12 deficiency 04/01/2014  . Acid reflux 03/31/2014  . Diabetes mellitus, type 2 ([place]) 03/31/2014  . Insomnia, persistent 04/01/2014  . 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 Abnormal finding on mammography 11/21/2012    Overview:  Biopsied 11/25/2012 by Dr. Pat Patrick; was due for screening mammogram February 2015.   Marland Kitchen Chronic LBP 05/02/2015  . Clinical depression 03/31/2014    Overview:  S/P suicide attempt 02/1995   . HLD (hyperlipidemia) 03/31/2014  . Apnea, sleep 03/31/2014    Overview:  on CPAP   . Obesity 05/27/2015   Past Surgical History  Procedure Laterality Date  . Abdominal hysterectomy      Total  . Laparoscopic cholecystectomy  08/14/2010    Dr. Pat Patrick  . Back surgery      Multiple  . Neck surgery      Multiple  . Knee surgery Left   . Breast biopsy Right 06/20/09    neg  . Breast biopsy Left 12/11/12    neg  . Breast biopsy Left 06/04/2015   Family History  Problem Relation Age of Onset  . Hypertension    . Stroke    . Diabetes    . Heart disease    . Hypertension Mother   . Diabetes Brother    Social History   Social History  . Marital Status: Married    Spouse Name: N/A  . Number of Children:  N/A  . Years of Education: N/A   Social History Main Topics  . Smoking status: Current Every Day Smoker -- 1.00 packs/day    Types: Cigarettes  . Smokeless tobacco: Never Used  . Alcohol Use: No  . Drug Use: No  . Sexual Activity: Not Asked   Other Topics Concern  . None   Social History Narrative    Current outpatient prescriptions:  .  amLODipine (NORVASC) 10 MG tablet, Take 1 tablet by mouth daily., Disp: , Rfl: 0 .  aspirin EC 81 MG tablet, Take 1 tablet by mouth daily., Disp: , Rfl:  .  atorvastatin (LIPITOR) 20 MG tablet, Take 1 tablet by mouth at bedtime., Disp: , Rfl:  .  carvedilol (COREG) 25 MG tablet, Take 1 tablet by mouth 2 (two) times daily., Disp: , Rfl:  .  cetirizine (ZYRTEC) 10 MG tablet, Take 1 tablet by mouth daily., Disp: , Rfl:  .  Cholecalciferol (D 5000) 5000 UNITS TABS, Take 1 Dose by mouth 1 day or 1 dose., Disp: , Rfl:  .  Cholecalciferol (D-5000) 5000 UNITS TABS, Take 1 tablet by mouth daily., Disp: , Rfl:  .  cyanocobalamin (V-R VITAMIN B-12) 500 MCG tablet, Take 1  tablet by mouth daily., Disp: , Rfl:  .  insulin NPH-regular Human (NOVOLIN 70/30) (70-30) 100 UNIT/ML injection, Inject 15-20 Units into the skin 2 (two) times daily. 15 units in am and 20 units in pm, Disp: , Rfl:  .  lisinopril (PRINIVIL,ZESTRIL) 40 MG tablet, Take 1 tablet by mouth daily., Disp: , Rfl: 0 .  naproxen sodium (RA NAPROXEN SODIUM) 220 MG tablet, Take 1 tablet by mouth 2 (two) times daily., Disp: , Rfl:  .  omeprazole (PRILOSEC) 20 MG capsule, Take 1 capsule by mouth daily., Disp: , Rfl:  .  traZODone (DESYREL) 150 MG tablet, Take 1 tablet by mouth at bedtime., Disp: , Rfl:  .  ZOSTAVAX 92426 UNT/0.65ML injection, , Disp: , Rfl:   Allergies  Allergen Reactions  . Chlorthalidone Other (See Comments)  . Ciprofloxacin Swelling    Swelling of hands and feet  . Codeine Nausea Only  . Diclofenac     Other reaction(s): Abdominal Pain  . Hydrochlorothiazide     Other  reaction(s): Other (See Comments) Leg cramps     Review of Systems  Constitutional: Negative for fever, activity change and fatigue.  Cardiovascular: Negative for chest pain, palpitations and leg swelling.  Skin: Negative for color change, pallor, rash and wound.  Hematological: Negative for adenopathy. Bruises/bleeds easily.  All other systems reviewed and are negative.      Filed Vitals:   06/30/15 1103  BP: 168/76  Pulse: 73  Temp: 98.4 F (36.9 C)    Objective:   Physical Exam  Constitutional: She is oriented to person, place, and time. She appears well-developed and well-nourished.  HENT:  Head: Normocephalic and atraumatic.  Right Ear: External ear normal.  Left Ear: External ear normal.  Mouth/Throat: Oropharynx is clear and moist. No oropharyngeal exudate.  Eyes: Conjunctivae are normal. Pupils are equal, round, and reactive to light. No scleral icterus.  Neck: Normal range of motion. Neck supple. No tracheal deviation present. No thyromegaly present.  Cardiovascular: Normal rate and regular rhythm.  Exam reveals no gallop.   No murmur heard. Pulmonary/Chest: Effort normal. No respiratory distress. She has no wheezes.  Abdominal: Soft. Bowel sounds are normal. She exhibits no distension.  Musculoskeletal: Normal range of motion. She exhibits no edema or tenderness.  Lymphadenopathy:    She has no cervical adenopathy.  Neurological: She is alert and oriented to person, place, and time.  Skin: Skin is warm and dry.  Breast exam:  Right breast and axilla, normal appearing no palpable masses, well healed right sided lateral scar, no lymphadenopathy  Left Breast: no masses, nipple retract or skin changes, tape marks still on breast, no palpable masses, no lypmhadenopathy  Psychiatric: She has a normal mood and affect. Her behavior is normal. Judgment and thought content normal.  Vitals reviewed.      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  I discussed the surgical options available given her health, size, location and type of cancer.  I informed the patient that lumpectomy with radiation treatment as well as mastectomy were both options for her, I discussed the pros and cons of each and informed her that her risk of recurrence was comparable with both operations.  I also discussed the risks and benefits of operation including bleeding, infection, seroma, hematoma,  need for recurrent surgeries, damage to surrounding structures and anesthesia risks. I also discussed the risk and benefits of sentinel lymph node biopsy which included the above as well as potential nerve damage and need for further operation as well.  Patient was given the opportunity to ask questions and have them answered.    Left breast needle localization lumpectomy with sentinel lymph node biopsy will be scheduled.  Will also order CBC, CMP and CXR.    I spent 35 minutes discussing the disease and the above treatment planning with the patient.

## 2015-07-02 ENCOUNTER — Other Ambulatory Visit: Payer: Self-pay | Admitting: Surgery

## 2015-07-02 ENCOUNTER — Telehealth: Payer: Self-pay | Admitting: Surgery

## 2015-07-02 DIAGNOSIS — C50912 Malignant neoplasm of unspecified site of left female breast: Secondary | ICD-10-CM

## 2015-07-02 NOTE — Telephone Encounter (Signed)
Pt advised of pre op date/time and sx date. Sx: 07/18/15--Dr Loflin--Left needle loc lumpectomy with SN injection. Pre op: 07/07/15 bw 9-1--Phone.   Patient was advised to arrive at [facility] at 9:00AM the day of surgery.

## 2015-07-02 NOTE — Progress Notes (Signed)
[place]  [phone] [phone]  ID: Robin Huff OB: [birth year]  MR#: [id number]  HBZ#:[id number]  Patient Care Team: Ezequiel Kayser, MD as PCP - General (Internal Medicine)  CHIEF COMPLAINT:  Chief Complaint  Patient presents with  . New Evaluation    L Breast invasive carcinoma    INTERVAL HISTORY: Patient is a 69 year old female who was found to have an abnormal mammogram. Subsequent biopsy confirmed invasive breast cancer. She currently feels well and is asymptomatic. She has no neurologic complaints. She denies any recent fevers. She has no chest pain or shortness of breath. She denies any nausea, vomiting, constipation, or diarrhea. She has no urinary complaints. Patient feels at her baseline and offers no specific complaints today.  REVIEW OF SYSTEMS:   Review of Systems  Constitutional: Negative for fever, weight loss and malaise/fatigue.  Respiratory: Negative.   Cardiovascular: Negative.   Gastrointestinal: Negative.   Genitourinary: Negative.   Musculoskeletal: Negative.   Neurological: Negative.  Negative for weakness.    As per HPI. Otherwise, a complete review of systems is negatve.  PAST MEDICAL HISTORY: Past Medical History  Diagnosis Date  . Cholelithiasis 05/27/2015    Requiring Cholecystectomy 08/14/2014- Dr. Pat Patrick   . BP (high blood pressure) 03/31/2014  . B12 deficiency 04/01/2014  . Acid reflux 03/31/2014  . Diabetes mellitus, type 2 ([place]) 03/31/2014  . Insomnia, persistent 04/01/2014  . 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 Abnormal finding on mammography 11/21/2012    Overview:  Biopsied 11/25/2012 by Dr. Pat Patrick; was due for screening mammogram February 2015.   Marland Kitchen Chronic LBP 05/02/2015  . Clinical depression 03/31/2014    Overview:  S/P suicide attempt 02/1995   . HLD (hyperlipidemia) 03/31/2014  . Apnea, sleep 03/31/2014    Overview:  on CPAP   . Obesity 05/27/2015    PAST SURGICAL HISTORY: Past Surgical History  Procedure  Laterality Date  . Abdominal hysterectomy      Total  . Laparoscopic cholecystectomy  08/14/2010    Dr. Pat Patrick  . Back surgery      Multiple  . Neck surgery      Multiple  . Knee surgery Left   . Breast biopsy Right 06/20/09    neg  . Breast biopsy Left 12/11/12    neg  . Breast biopsy Left 06/04/2015    FAMILY HISTORY Family History  Problem Relation Age of Onset  . Hypertension    . Stroke    . Diabetes    . Heart disease    . Hypertension Mother   . Diabetes Brother        ADVANCED DIRECTIVES:    HEALTH MAINTENANCE: Social History  Substance Use Topics  . Smoking status: Current Every Day Smoker -- 1.00 packs/day    Types: Cigarettes  . Smokeless tobacco: Never Used  . Alcohol Use: No     Colonoscopy:  PAP:  Bone density:  Lipid panel:  Allergies  Allergen Reactions  . Chlorthalidone Other (See Comments)  . Ciprofloxacin Swelling    Swelling of hands and feet  . Codeine Nausea Only  . Diclofenac     Other reaction(s): Abdominal Pain  . Hydrochlorothiazide     Other reaction(s): Other (See Comments) Leg cramps    Current Outpatient Prescriptions  Medication Sig Dispense Refill  . amLODipine (NORVASC) 10 MG tablet Take 1 tablet by mouth daily.  0  . aspirin EC 81 MG tablet Take 1 tablet by mouth daily.    Marland Kitchen  atorvastatin (LIPITOR) 20 MG tablet Take 1 tablet by mouth at bedtime.    . carvedilol (COREG) 25 MG tablet Take 1 tablet by mouth 2 (two) times daily.    . cetirizine (ZYRTEC) 10 MG tablet Take 1 tablet by mouth daily.    . Cholecalciferol (D 5000) 5000 UNITS TABS Take 1 Dose by mouth 1 day or 1 dose.    . Cholecalciferol (D-5000) 5000 UNITS TABS Take 1 tablet by mouth daily.    . cyanocobalamin (V-R VITAMIN B-12) 500 MCG tablet Take 1 tablet by mouth daily.    . insulin NPH-regular Human (NOVOLIN 70/30) (70-30) 100 UNIT/ML injection Inject 15-20 Units into the skin 2 (two) times daily. 15 units in am and 20 units in pm    . lisinopril  (PRINIVIL,ZESTRIL) 40 MG tablet Take 1 tablet by mouth daily.  0  . naproxen sodium (RA NAPROXEN SODIUM) 220 MG tablet Take 1 tablet by mouth 2 (two) times daily.    Marland Kitchen omeprazole (PRILOSEC) 20 MG capsule Take 1 capsule by mouth daily.    . traZODone (DESYREL) 150 MG tablet Take 1 tablet by mouth at bedtime.    Marland Kitchen ZOSTAVAX 39030 UNT/0.65ML injection      No current facility-administered medications for this visit.    OBJECTIVE: Filed Vitals:   06/20/15 0916  BP: 164/77  Pulse: 63  Temp: 98 F (36.7 C)     Body mass index is 37.55 kg/(m^2).    ECOG FS:0 - Asymptomatic  General: Well-developed, well-nourished, no acute distress. Eyes: Pink conjunctiva, anicteric sclera. Breasts: Left breast with no palpable lumps or masses. Lungs: Clear to auscultation bilaterally. Heart: Regular rate and rhythm. No rubs, murmurs, or gallops. Abdomen: Soft, nontender, nondistended. No organomegaly noted, normoactive bowel sounds. Musculoskeletal: No edema, cyanosis, or clubbing. Neuro: Alert, answering all questions appropriately. Cranial nerves grossly intact. Skin: No rashes or petechiae noted. Psych: Normal affect.   LAB RESULTS:  Lab Results  Component Value Date   NA 143 04/05/2012   K 3.7 04/05/2012   CL 107 04/05/2012   CO2 28 04/05/2012   GLUCOSE 100* 04/05/2012   BUN 21* 04/05/2012   CREATININE 0.71 04/05/2012   CALCIUM 9.1 04/05/2012   GFRNONAA >60 04/05/2012   GFRAA >60 04/05/2012    Lab Results  Component Value Date   WBC 13.6* 04/05/2012   HGB 13.2 04/05/2012   HCT 40.6 04/05/2012   MCV 98 04/05/2012   PLT 212 04/05/2012     STUDIES: Mm Digital Diagnostic Unilat L  06/10/2015   ADDENDUM REPORT: 06/10/2015 09:33 ADDENDUM: Pathology results: Pathology results from the ultrasound-guided biopsy of the left breast mass at 12 o'clock revealed grade 1 invasive mammary carcinoma. This is concordant with the imaging findings. She has already seen her surgeon, Dr. Pat Patrick, and has  been notified of the results. She will be following up at the cancer center likely next week and is waiting to hear back from her surgeon's office. She is doing well and denies any biopsy site complications. The patient has been instructed to call the [facility] with any questions or concerns. Electronically Signed   By: Everlean Alstrom M.D.   On: 06/10/2015 09:33  06/10/2015   CLINICAL DATA:  Status post ultrasound-guided core biopsy of the left breast mass.  EXAM: DIAGNOSTIC LEFT MAMMOGRAM POST ULTRASOUND BIOPSY  COMPARISON:  Previous exam(s).  FINDINGS: Mammographic images were obtained following ultrasound guided biopsy of a mass in the 12 o'clock region of the left breast. Mammographic  images showed there is a coil shaped clip associated with the mass in the 12 o'clock region of the left breast.  IMPRESSION: Status post ultrasound-guided core biopsy of the left breast with pathology pending.  Final Assessment: Post Procedure Mammograms for Marker Placement  Electronically Signed: By: Lillia Mountain M.D. On: 06/04/2015 13:39   [place] Lt Breast Bx W Loc Dev 1st Lesion Img Bx Spec US Guide  06/04/2015   CLINICAL DATA:  Suspicious left breast mass.  EXAM: ULTRASOUND GUIDED LEFT BREAST CORE NEEDLE BIOPSY  COMPARISON:  Previous exam(s).  PROCEDURE: I met with the patient and we discussed the procedure of ultrasound-guided biopsy, including benefits and alternatives. We discussed the high likelihood of a successful procedure. We discussed the risks of the procedure including infection, bleeding, tissue injury, clip migration, and inadequate sampling. Informed written consent was given. The usual time-out protocol was performed immediately prior to the procedure.  Using sterile technique and 2% Lidocaine as local anesthetic, under direct ultrasound visualization, a 12 gauge vacuum-assisted device was used to perform biopsy of a mass in the 12 o'clock region of the left breastusing a lateral to medial approach. At the  conclusion of the procedure, a coil shaped tissue marker clip was deployed into the biopsy cavity. Follow-up 2-view mammogram was performed and dictated separately.  IMPRESSION: Ultrasound-guided biopsy of the left breast. No apparent complications.   Electronically Signed   By: Lillia Mountain M.D.   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  PLAN:    1. Breast cancer: After lengthy discussion with the patient, she likely will proceed with lumpectomy and sentinel node biopsy and has an appointment with surgery in approximately one week. If she has lumpectomy, she will benefit from adjuvant XRT. Given the stage of her tumor, will order Oncotype DX to assess whether chemotherapy is necessary. She will also benefit from 5 years of aromatase inhibitor at the completion of her treatments. Patient will return to clinic one to 2 weeks after her surgery to discuss her final pathology results, her Oncotype testing, and treatment planning.   Patient will return to clinic Patient expressed understanding and was in agreement with this plan. She also understands that She can call clinic at any time with any questions, concerns, or complaints.   Breast cancer, stage 1 ([place])   Staging form: Breast, AJCC 7th Edition     Clinical stage from 07/02/2015: Stage IA (T1c, N0, M0) - Signed by Lloyd Huger, MD on 07/02/2015   Lloyd Huger, MD   07/02/2015 2:13 PM

## 2015-07-07 ENCOUNTER — Encounter: Payer: Self-pay | Admitting: *Deleted

## 2015-07-07 ENCOUNTER — Other Ambulatory Visit: Payer: Medicare Other

## 2015-07-07 NOTE — Patient Instructions (Signed)
  Your procedure is scheduled on: 07-18-15 Report to [facility]   Remember: Instructions that are not followed completely may result in serious medical risk, up to and including death, or upon the discretion of your surgeon and anesthesiologist your surgery may need to be rescheduled.    _X___ 1. Do not eat food or drink liquids after midnight. No gum chewing or hard candies.     _X___ 2. No Alcohol for 24 hours before or after surgery.   ____ 3. Bring all medications with you on the day of surgery if instructed.    _X___ 4. Notify your doctor if there is any change in your medical condition     (cold, fever, infections).     Do not wear jewelry, make-up, hairpins, clips or nail polish.  Do not wear lotions, powders, or perfumes. You may wear deodorant.  Do not shave 48 hours prior to surgery. Men may shave face and neck.  Do not bring valuables to the hospital.    [facility] is not responsible for any belongings or valuables.               Contacts, dentures or bridgework may not be worn into surgery.  Leave your suitcase in the car. After surgery it may be brought to your room.  For patients admitted to the hospital, discharge time is determined by your  treatment team.   Patients discharged the day of surgery will not be allowed to drive home.   Please read over the following fact sheets that you were given:     _X___ Take these medicines the morning of surgery with A SIP OF WATER:    1. AMLODIPINE (NORVASC)  2. CARVEDILOL (COREG)  3. LISINOPRIL  4. PRILOSEC (OMEPRAZOLE)  5. TAKE AN EXTRA PRILOSEC ON Thursday NIGHT  6.  ____ Fleet Enema (as directed)   _X___ Use CHG Soap as directed  ____ Use inhalers on the day of surgery  ____ Stop metformin 2 days prior to surgery    ____ Take 1/2 of usual insulin dose the night before surgery and none on the morning of surgery.   ____ Stop Coumadin/Plavix/aspirin-OK TO CONTINUE 81 MG ASPIRIN PER DR LOFLIN PER  PATIENT  _X___ Stop Anti-inflammatories-STOP NAPROXEN 7 DAYS PRIOR TO SURGERY-NO NSAIDS OR ASPIRIN PRODUCTS-TYLENOL OK   _X___ Stop supplements until after surgery-STOP FISH OIL 7 DAYS PRIOR TO SURGERY   _X___ Bring C-Pap to the hospital.

## 2015-07-08 ENCOUNTER — Encounter: Payer: Self-pay | Admitting: *Deleted

## 2015-07-08 NOTE — Progress Notes (Signed)
Oncology Nurse Navigator Documentation  Oncology Nurse Navigator Flowsheets 06/20/2015 07/08/2015  Referral date to RadOnc/MedOnc 06/20/2015 -  Navigator Encounter Type Initial MedOnc Telephone  Patient Visit Type Medonc Follow-up  Barriers/Navigation Needs No barriers at this time No barriers at this time  Time Spent with Patient 75 15   Talked to patient today.  States her surgery is next Friday.  No needs at this time.  To call if she has any questions or concerns.

## 2015-07-10 ENCOUNTER — Encounter
Admission: RE | Admit: 2015-07-10 | Discharge: 2015-07-10 | Disposition: A | Discharge status: Home / Self Care | Payer: Medicare Other | Source: Ambulatory Visit | Attending: Anesthesiology | Admitting: Anesthesiology

## 2015-07-10 ENCOUNTER — Ambulatory Visit
Admission: RE | Admit: 2015-07-10 | Discharge: 2015-07-10 | Disposition: A | Discharge status: Home / Self Care | Payer: Medicare Other | Source: Ambulatory Visit | Attending: Surgery | Admitting: Surgery

## 2015-07-10 DIAGNOSIS — I10 Essential (primary) hypertension: Secondary | ICD-10-CM | POA: Insufficient documentation

## 2015-07-10 DIAGNOSIS — C50912 Malignant neoplasm of unspecified site of left female breast: Secondary | ICD-10-CM | POA: Diagnosis not present

## 2015-07-10 LAB — COMPREHENSIVE METABOLIC PANEL
ALBUMIN: 3.6 g/dL (ref 3.5–5.0)
ALK PHOS: 62 U/L (ref 38–126)
ALT: 16 U/L (ref 14–54)
ANION GAP: 6 (ref 5–15)
AST: 17 U/L (ref 15–41)
BUN: 18 mg/dL (ref 6–20)
CALCIUM: 8.6 mg/dL — AB (ref 8.9–10.3)
CO2: 27 mmol/L (ref 22–32)
CREATININE: 0.6 mg/dL (ref 0.44–1.00)
Chloride: 105 mmol/L (ref 101–111)
GFR calc Af Amer: >60 mL/min (ref >60)
GFR calc non Af Amer: >60 mL/min (ref >60)
GLUCOSE: 91 mg/dL (ref 65–99)
Potassium: 4 mmol/L (ref 3.5–5.1)
SODIUM: 138 mmol/L (ref 135–145)
Total Bilirubin: 0.6 mg/dL (ref 0.3–1.2)
Total Protein: 7.3 g/dL (ref 6.5–8.1)

## 2015-07-10 LAB — PROTIME-INR
INR: 1.37
Prothrombin Time: 17.1 seconds — ABNORMAL HIGH (ref 11.4–15.0)

## 2015-07-10 LAB — CBC WITH DIFFERENTIAL/PLATELET
BASOS ABS: 0.1 10*3/uL (ref 0–0.1)
BASOS PCT: 1 %
EOS ABS: 0.2 10*3/uL (ref 0–0.7)
Eosinophils Relative: 1 %
HCT: 44.4 % (ref 35.0–47.0)
HEMOGLOBIN: 14.6 g/dL (ref 12.0–16.0)
LYMPHS ABS: 2.2 10*3/uL (ref 1.0–3.6)
Lymphocytes Relative: 18 %
MCH: 31.7 pg (ref 26.0–34.0)
MCHC: 32.9 g/dL (ref 32.0–36.0)
MCV: 96.4 fL (ref 80.0–100.0)
Monocytes Absolute: 1.1 10*3/uL — ABNORMAL HIGH (ref 0.2–0.9)
Monocytes Relative: 9 %
NEUTROS PCT: 71 %
Neutro Abs: 8.7 10*3/uL — ABNORMAL HIGH (ref 1.4–6.5)
Platelets: 216 10*3/uL (ref 150–440)
RBC: 4.61 MIL/uL (ref 3.80–5.20)
RDW: 14.5 % (ref 11.5–14.5)
WBC: 12.2 10*3/uL — AB (ref 3.6–11.0)

## 2015-07-11 ENCOUNTER — Encounter: Payer: Self-pay | Admitting: *Deleted

## 2015-07-18 ENCOUNTER — Ambulatory Visit
Admission: RE | Admit: 2015-07-18 | Discharge: 2015-07-18 | Disposition: A | Discharge status: Home / Self Care | Payer: Medicare Other | Source: Ambulatory Visit | Attending: Surgery | Admitting: Surgery

## 2015-07-18 ENCOUNTER — Ambulatory Visit: Payer: Medicare Other

## 2015-07-18 ENCOUNTER — Ambulatory Visit: Payer: Medicare Other | Admitting: Anesthesiology

## 2015-07-18 ENCOUNTER — Encounter: Payer: Self-pay | Admitting: Anesthesiology

## 2015-07-18 ENCOUNTER — Encounter
Admission: RE | Disposition: A | Discharge status: Home / Self Care | Payer: Self-pay | Source: Ambulatory Visit | Attending: Surgery

## 2015-07-18 ENCOUNTER — Encounter: Payer: Self-pay | Admitting: *Deleted

## 2015-07-18 DIAGNOSIS — J449 Chronic obstructive pulmonary disease, unspecified: Secondary | ICD-10-CM | POA: Diagnosis not present

## 2015-07-18 DIAGNOSIS — C50212 Malignant neoplasm of upper-inner quadrant of left female breast: Secondary | ICD-10-CM | POA: Diagnosis not present

## 2015-07-18 DIAGNOSIS — Z7982 Long term (current) use of aspirin: Secondary | ICD-10-CM | POA: Insufficient documentation

## 2015-07-18 DIAGNOSIS — G4730 Sleep apnea, unspecified: Secondary | ICD-10-CM | POA: Insufficient documentation

## 2015-07-18 DIAGNOSIS — Z823 Family history of stroke: Secondary | ICD-10-CM | POA: Diagnosis not present

## 2015-07-18 DIAGNOSIS — C50912 Malignant neoplasm of unspecified site of left female breast: Secondary | ICD-10-CM

## 2015-07-18 DIAGNOSIS — M545 Low back pain: Secondary | ICD-10-CM | POA: Insufficient documentation

## 2015-07-18 DIAGNOSIS — Z888 Allergy status to other drugs, medicaments and biological substances status: Secondary | ICD-10-CM | POA: Diagnosis not present

## 2015-07-18 DIAGNOSIS — Z8249 Family history of ischemic heart disease and other diseases of the circulatory system: Secondary | ICD-10-CM | POA: Insufficient documentation

## 2015-07-18 DIAGNOSIS — Z794 Long term (current) use of insulin: Secondary | ICD-10-CM | POA: Insufficient documentation

## 2015-07-18 DIAGNOSIS — I10 Essential (primary) hypertension: Secondary | ICD-10-CM | POA: Insufficient documentation

## 2015-07-18 DIAGNOSIS — E538 Deficiency of other specified B group vitamins: Secondary | ICD-10-CM | POA: Diagnosis not present

## 2015-07-18 DIAGNOSIS — F17210 Nicotine dependence, cigarettes, uncomplicated: Secondary | ICD-10-CM | POA: Diagnosis not present

## 2015-07-18 DIAGNOSIS — Z90710 Acquired absence of both cervix and uterus: Secondary | ICD-10-CM | POA: Insufficient documentation

## 2015-07-18 DIAGNOSIS — Z881 Allergy status to other antibiotic agents status: Secondary | ICD-10-CM | POA: Diagnosis not present

## 2015-07-18 DIAGNOSIS — Z9049 Acquired absence of other specified parts of digestive tract: Secondary | ICD-10-CM | POA: Insufficient documentation

## 2015-07-18 DIAGNOSIS — G8929 Other chronic pain: Secondary | ICD-10-CM | POA: Diagnosis not present

## 2015-07-18 DIAGNOSIS — Z170 Estrogen receptor positive status [ER+]: Secondary | ICD-10-CM | POA: Insufficient documentation

## 2015-07-18 DIAGNOSIS — G4700 Insomnia, unspecified: Secondary | ICD-10-CM | POA: Diagnosis not present

## 2015-07-18 DIAGNOSIS — F329 Major depressive disorder, single episode, unspecified: Secondary | ICD-10-CM | POA: Insufficient documentation

## 2015-07-18 DIAGNOSIS — D649 Anemia, unspecified: Secondary | ICD-10-CM | POA: Diagnosis not present

## 2015-07-18 DIAGNOSIS — Z79899 Other long term (current) drug therapy: Secondary | ICD-10-CM | POA: Insufficient documentation

## 2015-07-18 DIAGNOSIS — Z6836 Body mass index (BMI) 36.0-36.9, adult: Secondary | ICD-10-CM | POA: Diagnosis not present

## 2015-07-18 DIAGNOSIS — E669 Obesity, unspecified: Secondary | ICD-10-CM | POA: Insufficient documentation

## 2015-07-18 DIAGNOSIS — Z885 Allergy status to narcotic agent status: Secondary | ICD-10-CM | POA: Diagnosis not present

## 2015-07-18 DIAGNOSIS — Z833 Family history of diabetes mellitus: Secondary | ICD-10-CM | POA: Diagnosis not present

## 2015-07-18 DIAGNOSIS — K219 Gastro-esophageal reflux disease without esophagitis: Secondary | ICD-10-CM | POA: Insufficient documentation

## 2015-07-18 DIAGNOSIS — E785 Hyperlipidemia, unspecified: Secondary | ICD-10-CM | POA: Diagnosis not present

## 2015-07-18 DIAGNOSIS — E119 Type 2 diabetes mellitus without complications: Secondary | ICD-10-CM | POA: Insufficient documentation

## 2015-07-18 HISTORY — PX: SENTINEL NODE BIOPSY: SHX6608

## 2015-07-18 HISTORY — PX: BREAST LUMPECTOMY: SHX2

## 2015-07-18 HISTORY — PX: BREAST LUMPECTOMY WITH SENTINEL LYMPH NODE BIOPSY: SHX5597

## 2015-07-18 HISTORY — DX: Cardiac arrhythmia, unspecified: I49.9

## 2015-07-18 HISTORY — DX: Anemia, unspecified: D64.9

## 2015-07-18 HISTORY — DX: Chronic obstructive pulmonary disease, unspecified: J44.9

## 2015-07-18 LAB — GLUCOSE, CAPILLARY: GLUCOSE-CAPILLARY: 83 mg/dL (ref 65–99)

## 2015-07-18 SURGERY — BREAST LUMPECTOMY WITH SENTINEL LYMPH NODE BX
Anesthesia: General | Laterality: Left

## 2015-07-18 MED ORDER — FENTANYL CITRATE (PF) 100 MCG/2ML IJ SOLN
25.0000 ug | INTRAMUSCULAR | Status: DC | PRN
  Administered 2015-07-18 (×4): 25 ug via INTRAVENOUS

## 2015-07-18 MED ORDER — BUPIVACAINE-EPINEPHRINE (PF) 0.25% -1:200000 IJ SOLN
INTRAMUSCULAR | Status: DC | PRN
  Administered 2015-07-18: 30 mL via PERINEURAL

## 2015-07-18 MED ORDER — SODIUM CHLORIDE 0.9 % IJ SOLN
INTRAMUSCULAR | Status: AC
  Filled 2015-07-18: qty 10

## 2015-07-18 MED ORDER — SODIUM CHLORIDE 0.9 % IV SOLN: INTRAVENOUS | Status: DC

## 2015-07-18 MED ORDER — ISOSULFAN BLUE 1 % ~~LOC~~ SOLN
SUBCUTANEOUS | Status: AC
  Filled 2015-07-18: qty 5

## 2015-07-18 MED ORDER — FENTANYL CITRATE (PF) 100 MCG/2ML IJ SOLN
INTRAMUSCULAR | Status: DC | PRN
  Administered 2015-07-18: 100 ug via INTRAVENOUS
  Administered 2015-07-18: 25 ug via INTRAVENOUS
  Administered 2015-07-18: 50 ug via INTRAVENOUS
  Administered 2015-07-18: 25 ug via INTRAVENOUS
  Administered 2015-07-18: 50 ug via INTRAVENOUS

## 2015-07-18 MED ORDER — ROCURONIUM BROMIDE 100 MG/10ML IV SOLN
INTRAVENOUS | Status: DC | PRN
  Administered 2015-07-18: 10 mg via INTRAVENOUS
  Administered 2015-07-18: 5 mg via INTRAVENOUS
  Administered 2015-07-18: 35 mg via INTRAVENOUS

## 2015-07-18 MED ORDER — BUPIVACAINE-EPINEPHRINE (PF) 0.25% -1:200000 IJ SOLN
INTRAMUSCULAR | Status: AC
  Filled 2015-07-18: qty 30

## 2015-07-18 MED ORDER — ROCURONIUM BROMIDE 100 MG/10ML IV SOLN: INTRAVENOUS | Status: DC | PRN

## 2015-07-18 MED ORDER — OXYCODONE-ACETAMINOPHEN 5-325 MG PO TABS: 1.0000 | ORAL_TABLET | ORAL | Status: DC | PRN

## 2015-07-18 MED ORDER — HYDROMORPHONE HCL 1 MG/ML IJ SOLN
INTRAMUSCULAR | Status: AC
  Filled 2015-07-18: qty 1

## 2015-07-18 MED ORDER — GLYCOPYRROLATE 0.2 MG/ML IJ SOLN
INTRAMUSCULAR | Status: DC | PRN
  Administered 2015-07-18: .6 mg via INTRAVENOUS

## 2015-07-18 MED ORDER — FENTANYL CITRATE (PF) 100 MCG/2ML IJ SOLN
INTRAMUSCULAR | Status: AC
  Filled 2015-07-18: qty 2

## 2015-07-18 MED ORDER — CEFAZOLIN SODIUM-DEXTROSE 2-3 GM-% IV SOLR: 2.0000 g | INTRAVENOUS | Status: DC

## 2015-07-18 MED ORDER — OXYCODONE-ACETAMINOPHEN 5-325 MG PO TABS
ORAL_TABLET | ORAL | Status: AC
  Administered 2015-07-18: 1 via ORAL
  Filled 2015-07-18: qty 1

## 2015-07-18 MED ORDER — SUCCINYLCHOLINE CHLORIDE 20 MG/ML IJ SOLN
INTRAMUSCULAR | Status: DC | PRN
  Administered 2015-07-18: 120 mg via INTRAVENOUS

## 2015-07-18 MED ORDER — OXYCODONE-ACETAMINOPHEN 5-325 MG PO TABS
1.0000 | ORAL_TABLET | ORAL | Status: DC | PRN
  Administered 2015-07-18: 1 via ORAL

## 2015-07-18 MED ORDER — CEFAZOLIN SODIUM-DEXTROSE 2-3 GM-% IV SOLR
INTRAVENOUS | Status: AC
  Administered 2015-07-18: 2 g
  Filled 2015-07-18: qty 50

## 2015-07-18 MED ORDER — LIDOCAINE HCL (CARDIAC) 20 MG/ML IV SOLN
INTRAVENOUS | Status: DC | PRN
  Administered 2015-07-18: 40 mg via INTRAVENOUS

## 2015-07-18 MED ORDER — PHENYLEPHRINE HCL 10 MG/ML IJ SOLN
INTRAMUSCULAR | Status: DC | PRN
  Administered 2015-07-18 (×4): 100 ug via INTRAVENOUS

## 2015-07-18 MED ORDER — HYDROMORPHONE HCL 1 MG/ML IJ SOLN
0.5000 mg | INTRAMUSCULAR | Status: DC | PRN
  Administered 2015-07-18 (×2): 0.5 mg via INTRAVENOUS

## 2015-07-18 MED ORDER — METHYLPREDNISOLONE SODIUM SUCC 125 MG IJ SOLR
INTRAMUSCULAR | Status: DC | PRN
  Administered 2015-07-18: 125 mg via INTRAVENOUS

## 2015-07-18 MED ORDER — ONDANSETRON HCL 4 MG/2ML IJ SOLN
INTRAMUSCULAR | Status: DC | PRN
  Administered 2015-07-18: 4 mg via INTRAVENOUS

## 2015-07-18 MED ORDER — TECHNETIUM TC 99M SULFUR COLLOID
1.1000 | Freq: Once | INTRAVENOUS | Status: DC | PRN
  Administered 2015-07-18: 1.1 via INTRAVENOUS
  Filled 2015-07-18: qty 1.1

## 2015-07-18 MED ORDER — SODIUM CHLORIDE 0.9 % IV SOLN
INTRAVENOUS | Status: DC
  Administered 2015-07-18 (×2): via INTRAVENOUS

## 2015-07-18 MED ORDER — MIDAZOLAM HCL 2 MG/2ML IJ SOLN
INTRAMUSCULAR | Status: DC | PRN
  Administered 2015-07-18: 2 mg via INTRAVENOUS

## 2015-07-18 MED ORDER — ONDANSETRON HCL 4 MG/2ML IJ SOLN: 4.0000 mg | Freq: Once | INTRAMUSCULAR | Status: DC | PRN

## 2015-07-18 MED ORDER — CEFAZOLIN SODIUM-DEXTROSE 2-3 GM-% IV SOLR
INTRAVENOUS | Status: DC | PRN
  Administered 2015-07-18: 2 g via INTRAVENOUS

## 2015-07-18 MED ORDER — PROPOFOL 10 MG/ML IV BOLUS
INTRAVENOUS | Status: DC | PRN
  Administered 2015-07-18: 140 mg via INTRAVENOUS

## 2015-07-18 MED ORDER — NEOSTIGMINE METHYLSULFATE 10 MG/10ML IV SOLN
INTRAVENOUS | Status: DC | PRN
  Administered 2015-07-18: 4 mg via INTRAVENOUS

## 2015-07-18 MED ORDER — EPHEDRINE SULFATE 50 MG/ML IJ SOLN
INTRAMUSCULAR | Status: DC | PRN
  Administered 2015-07-18 (×2): 10 mg via INTRAVENOUS

## 2015-07-18 SURGICAL SUPPLY — 36 items
ADHESIVE MASTISOL STRL (MISCELLANEOUS) ×2 IMPLANT
APPLIER CLIP 9.375 SM OPEN (CLIP)
BLADE SURG 15 STRL LF DISP TIS (BLADE) ×1 IMPLANT
BLADE SURG 15 STRL SS (BLADE) ×1
CANISTER SUCT 1200ML W/VALVE (MISCELLANEOUS) ×2 IMPLANT
CHLORAPREP W/TINT 26ML (MISCELLANEOUS) ×4 IMPLANT
CLIP APPLIE 9.375 SM OPEN (CLIP) IMPLANT
CNTNR SPEC 2.5X3XGRAD LEK (MISCELLANEOUS)
CONT SPEC 4OZ STER OR WHT (MISCELLANEOUS)
CONTAINER SPEC 2.5X3XGRAD LEK (MISCELLANEOUS) IMPLANT
DRAPE LAPAROTOMY TRNSV 106X77 (MISCELLANEOUS) ×2 IMPLANT
GAUZE FLUFF 18X24 1PLY STRL (GAUZE/BANDAGES/DRESSINGS) ×2 IMPLANT
GLOVE BIO SURGEON STRL SZ8 (GLOVE) ×2 IMPLANT
GOWN STRL REUS W/ TWL LRG LVL3 (GOWN DISPOSABLE) ×2 IMPLANT
GOWN STRL REUS W/TWL LRG LVL3 (GOWN DISPOSABLE) ×2
KIT RM TURNOVER STRD PROC AR (KITS) ×2 IMPLANT
LABEL OR SOLS (LABEL) ×2 IMPLANT
NDL SAFETY 18GX1.5 (NEEDLE) IMPLANT
NDL SAFETY 22GX1.5 (NEEDLE) ×4 IMPLANT
NEEDLE HYPO 27GX1-1/4 (NEEDLE) ×2 IMPLANT
PACK BASIN MINOR ARMC (MISCELLANEOUS) ×2 IMPLANT
PAD GROUND ADULT SPLIT (MISCELLANEOUS) ×2 IMPLANT
SLEVE PROBE SENORX GAMMA FIND (MISCELLANEOUS) ×2 IMPLANT
SPONGE LAP 18X18 5 PK (GAUZE/BANDAGES/DRESSINGS) ×2 IMPLANT
STRIP CLOSURE SKIN 1/2X4 (GAUZE/BANDAGES/DRESSINGS) ×2 IMPLANT
SURGI-BRA M (MISCELLANEOUS) ×2 IMPLANT
SUT ETH BLK MONO 3 0 FS 1 12/B (SUTURE) ×2 IMPLANT
SUT MNCRL 4-0 (SUTURE) ×2
SUT MNCRL 4-0 27XMFL (SUTURE) ×2
SUT VIC AB 3-0 SH 27 (SUTURE) ×3
SUT VIC AB 3-0 SH 27X BRD (SUTURE) ×3 IMPLANT
SUTURE MNCRL 4-0 27XMF (SUTURE) ×2 IMPLANT
SYR 3ML LL SCALE MARK (SYRINGE) ×2 IMPLANT
SYRINGE 10CC LL (SYRINGE) ×4 IMPLANT
TAPE MICROFOAM 4IN (TAPE) ×2 IMPLANT
WATER STERILE IRR 1000ML POUR (IV SOLUTION) ×2 IMPLANT

## 2015-07-18 NOTE — Anesthesia Procedure Notes (Signed)
Procedure Name: Intubation Date/Time: 07/18/2015 11:15 AM Performed by: Allean Found Pre-anesthesia Checklist: Patient identified, Emergency Drugs available, Suction available, Patient being monitored and Timeout performed Patient Re-evaluated:Patient Re-evaluated prior to inductionOxygen Delivery Method: Circle system utilized Preoxygenation: Pre-oxygenation with 100% oxygen Intubation Type: IV induction Laryngoscope Size: Glidescope and 3 Grade View: Grade II Tube type: Oral Number of attempts: 1 Airway Equipment and Method: Rigid stylet Placement Confirmation: ETT inserted through vocal cords under direct vision,  positive ETCO2 and breath sounds checked- equal and bilateral Secured at: 22 cm Tube secured with: Tape Dental Injury: Teeth and Oropharynx as per pre-operative assessment  Difficulty Due To: Difficulty was anticipated

## 2015-07-18 NOTE — H&P (View-Only) (Signed)
Subjective:     Patient ID: Robin Huff, female   DOB: [birth year], 69 y.o.   MRN: [id number]  HPI  69 yr old female with PMHx of well controlled HTN with two medications, DM type 2 well controlled with Novolog and history of multiple breast biopsies.  Patient comes in today after having an abnormal mammogram, US guided breast biopsy and result of left breast 12'oclock, 1cm invasive ductal carcinoma with ER+, PR+, Her 2 -.  Patient denies any fever, chills, pain, feeling lumps under axilla or family history of breast cancers.  She has had needle biopsies and one surgical biopsy of right breast She comes in today to discuss surgical options.   Past Medical History  Diagnosis Date  . Cholelithiasis 05/27/2015    Requiring Cholecystectomy 08/14/2014- Dr. Pat Patrick   . BP (high blood pressure) 03/31/2014  . B12 deficiency 04/01/2014  . Acid reflux 03/31/2014  . Diabetes mellitus, type 2 ([place]) 03/31/2014  . Insomnia, persistent 04/01/2014  . 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 Abnormal finding on mammography 11/21/2012    Overview:  Biopsied 11/25/2012 by Dr. Pat Patrick; was due for screening mammogram February 2015.   Marland Kitchen Chronic LBP 05/02/2015  . Clinical depression 03/31/2014    Overview:  S/P suicide attempt 02/1995   . HLD (hyperlipidemia) 03/31/2014  . Apnea, sleep 03/31/2014    Overview:  on CPAP   . Obesity 05/27/2015   Past Surgical History  Procedure Laterality Date  . Abdominal hysterectomy      Total  . Laparoscopic cholecystectomy  08/14/2010    Dr. Pat Patrick  . Back surgery      Multiple  . Neck surgery      Multiple  . Knee surgery Left   . Breast biopsy Right 06/20/09    neg  . Breast biopsy Left 12/11/12    neg  . Breast biopsy Left 06/04/2015   Family History  Problem Relation Age of Onset  . Hypertension    . Stroke    . Diabetes    . Heart disease    . Hypertension Mother   . Diabetes Brother    Social History   Social History  . Marital Status: Married    Spouse Name: N/A  . Number of Children:  N/A  . Years of Education: N/A   Social History Main Topics  . Smoking status: Current Every Day Smoker -- 1.00 packs/day    Types: Cigarettes  . Smokeless tobacco: Never Used  . Alcohol Use: No  . Drug Use: No  . Sexual Activity: Not Asked   Other Topics Concern  . None   Social History Narrative    Current outpatient prescriptions:  .  amLODipine (NORVASC) 10 MG tablet, Take 1 tablet by mouth daily., Disp: , Rfl: 0 .  aspirin EC 81 MG tablet, Take 1 tablet by mouth daily., Disp: , Rfl:  .  atorvastatin (LIPITOR) 20 MG tablet, Take 1 tablet by mouth at bedtime., Disp: , Rfl:  .  carvedilol (COREG) 25 MG tablet, Take 1 tablet by mouth 2 (two) times daily., Disp: , Rfl:  .  cetirizine (ZYRTEC) 10 MG tablet, Take 1 tablet by mouth daily., Disp: , Rfl:  .  Cholecalciferol (D 5000) 5000 UNITS TABS, Take 1 Dose by mouth 1 day or 1 dose., Disp: , Rfl:  .  Cholecalciferol (D-5000) 5000 UNITS TABS, Take 1 tablet by mouth daily., Disp: , Rfl:  .  cyanocobalamin (V-R VITAMIN B-12) 500 MCG tablet, Take 1  tablet by mouth daily., Disp: , Rfl:  .  insulin NPH-regular Human (NOVOLIN 70/30) (70-30) 100 UNIT/ML injection, Inject 15-20 Units into the skin 2 (two) times daily. 15 units in am and 20 units in pm, Disp: , Rfl:  .  lisinopril (PRINIVIL,ZESTRIL) 40 MG tablet, Take 1 tablet by mouth daily., Disp: , Rfl: 0 .  naproxen sodium (RA NAPROXEN SODIUM) 220 MG tablet, Take 1 tablet by mouth 2 (two) times daily., Disp: , Rfl:  .  omeprazole (PRILOSEC) 20 MG capsule, Take 1 capsule by mouth daily., Disp: , Rfl:  .  traZODone (DESYREL) 150 MG tablet, Take 1 tablet by mouth at bedtime., Disp: , Rfl:  .  ZOSTAVAX 92426 UNT/0.65ML injection, , Disp: , Rfl:   Allergies  Allergen Reactions  . Chlorthalidone Other (See Comments)  . Ciprofloxacin Swelling    Swelling of hands and feet  . Codeine Nausea Only  . Diclofenac     Other reaction(s): Abdominal Pain  . Hydrochlorothiazide     Other  reaction(s): Other (See Comments) Leg cramps     Review of Systems  Constitutional: Negative for fever, activity change and fatigue.  Cardiovascular: Negative for chest pain, palpitations and leg swelling.  Skin: Negative for color change, pallor, rash and wound.  Hematological: Negative for adenopathy. Bruises/bleeds easily.  All other systems reviewed and are negative.      Filed Vitals:   06/30/15 1103  BP: 168/76  Pulse: 73  Temp: 98.4 F (36.9 C)    Objective:   Physical Exam  Constitutional: She is oriented to person, place, and time. She appears well-developed and well-nourished.  HENT:  Head: Normocephalic and atraumatic.  Right Ear: External ear normal.  Left Ear: External ear normal.  Mouth/Throat: Oropharynx is clear and moist. No oropharyngeal exudate.  Eyes: Conjunctivae are normal. Pupils are equal, round, and reactive to light. No scleral icterus.  Neck: Normal range of motion. Neck supple. No tracheal deviation present. No thyromegaly present.  Cardiovascular: Normal rate and regular rhythm.  Exam reveals no gallop.   No murmur heard. Pulmonary/Chest: Effort normal. No respiratory distress. She has no wheezes.  Abdominal: Soft. Bowel sounds are normal. She exhibits no distension.  Musculoskeletal: Normal range of motion. She exhibits no edema or tenderness.  Lymphadenopathy:    She has no cervical adenopathy.  Neurological: She is alert and oriented to person, place, and time.  Skin: Skin is warm and dry.  Breast exam:  Right breast and axilla, normal appearing no palpable masses, well healed right sided lateral scar, no lymphadenopathy  Left Breast: no masses, nipple retract or skin changes, tape marks still on breast, no palpable masses, no lypmhadenopathy  Psychiatric: She has a normal mood and affect. Her behavior is normal. Judgment and thought content normal.  Vitals reviewed.      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  I discussed the surgical options available given her health, size, location and type of cancer.  I informed the patient that lumpectomy with radiation treatment as well as mastectomy were both options for her, I discussed the pros and cons of each and informed her that her risk of recurrence was comparable with both operations.  I also discussed the risks and benefits of operation including bleeding, infection, seroma, hematoma,  need for recurrent surgeries, damage to surrounding structures and anesthesia risks. I also discussed the risk and benefits of sentinel lymph node biopsy which included the above as well as potential nerve damage and need for further operation as well.  Patient was given the opportunity to ask questions and have them answered.    Left breast needle localization lumpectomy with sentinel lymph node biopsy will be scheduled.  Will also order CBC, CMP and CXR.    I spent 35 minutes discussing the disease and the above treatment planning with the patient.

## 2015-07-18 NOTE — Transfer of Care (Signed)
Immediate Anesthesia Transfer of Care Note  Patient: Robin Huff  Procedure(s) Performed: Procedure(s): BREAST LUMPECTOMY WITH SENTINEL LYMPH NODE BX (Left) SENTINEL NODE BIOPSY/NEEDLE LOC (Left)  Patient Location: PACU  Anesthesia Type:General  Level of Consciousness: awake and oriented  Airway & Oxygen Therapy: Patient Spontanous Breathing and Patient connected to face mask oxygen  Post-op Assessment: Report given to RN and Post -op Vital signs reviewed and stable  Post vital signs: Reviewed and stable  Last Vitals:  Filed Vitals:   07/18/15 1429  BP:   Pulse:   Temp: 36.1 C  Resp:     Complications: No apparent anesthesia complications

## 2015-07-18 NOTE — Discharge Instructions (Signed)

## 2015-07-18 NOTE — Op Note (Signed)
Breast Lumpectomy with Sentinal Node Biopsy Procedure Note  Indications: This patient presents with history of left breast cancer with clinically negative axillary lymph node exam.  Pre-operative Diagnosis: left breast cancer  Post-operative Diagnosis: left breast cancer  Surgeon: Hubbard Robinson   Anesthesia: General endotracheal anesthesia  ASA Class: 3  Procedure Details  The patient was seen in the Holding Room. The risks, benefits, complications, treatment options, and expected outcomes were discussed with the patient. The possibilities of reaction to medication, pulmonary aspiration, bleeding, infection, the need for additional procedures, failure to diagnose a condition, and creating a complication requiring transfusion or operation were discussed with the patient. The patient concurred with the proposed plan, giving informed consent.  The site of surgery properly noted/marked. The patient was taken to Operating Room, identified as Robin Huff and the procedure verified as Left Breast Lumpectomy and Sentinal Node Biopsy. A Time Out was held and the above information confirmed.  After induction of anesthesia, the left arm, breast, and chest were prepped and draped in standard fashion.  A diluted methylene blue dye was injected 10cc underneath the dermis of the nipple at four cardinal points.  Using a hand-held gamma probe, axillary sentinel nodes were identified transcutaneously.  An oblique incision was created below the axillary hairline.  Dissection was carried through the clavipectoral fascia. There were two axillary nodes removed in the first specimen, they were dissected apart using scissors.  The gamma probe count for #1 lymph node was 884 and #2 was 234.  An additional node counted 10% of maximal count at 49 and was also removed.  The area was irrigated out and hemostasis achieved with electrocautery.   The axillary incision was closed with a 3-0 Monocryl in the deep dermal  layer and 4-0 monocryl in a running subcuticular suture.    The lumpectomy was performed by creating a curvolinear incision along the superior edge of the nipple-areolar complex of the breast and superior incision around the previously placed localization guidewire leaving a small ellipse of skin around the wire.  Dissection was carried down to the pectoral fascia.  Orientation sutures were placed and the specimen sent for mammography.  Additional margins were taken from the inferior, superior, lateral and medial margins along the cavity.  Hemostasis was achieved with cautery.  The radiologist called and read the mammogram as good margins around and the pathologist called and stated the superior margin was close at 32mm, but with additional tissue removed was acceptable.  The wound was irrigated and closed with a 3-0 Monocryl in the deep dermal layer and 4-0 monocryl subcuticular closure.    Sterile glue was applied to both incisions and fluffs and bra was placed as a dressing. At the end of the operation, all sponge, instrument, and needle counts were correct.  Findings: grossly clear surgical margins, good margins on mammogram and acceptable margins per preliminary pathology read with additional margin  Estimated Blood Loss:  Minimal         Drains: none         Total IV Fluids: per anesthesia         Specimens: Lymph nodes 1, 2, and 3 and left breast lumpectomy with additional inferior, superior, lateral and medial margins         Implants: none         Complications:  None; patient tolerated the procedure well.         Disposition: PACU - hemodynamically stable.  Condition: stable

## 2015-07-18 NOTE — Progress Notes (Signed)
  Oncology Nurse Navigator Documentation    Navigator Encounter Type: Other (met prior to surgery) (07/18/15 1100) Patient Visit Type: Follow-up (07/18/15 1100) Treatment Phase: Other (surgery) (07/18/15 1100) Barriers/Navigation Needs: No barriers at this time (07/18/15 1100)   Interventions: None required (07/18/15 1100)    Met patient today in the breast center prior to her surgery.  Offered support.  She is to call if she has any questions or needs.        Time Spent with Patient: 15 (07/18/15 1100)

## 2015-07-18 NOTE — Anesthesia Preprocedure Evaluation (Signed)
Anesthesia Evaluation  Patient identified by MRN, date of birth, ID band Patient awake    Reviewed: Allergy & Precautions, NPO status , Patient's Chart, lab work & pertinent test results, reviewed documented beta blocker date and time   Airway Mallampati: III  TM Distance: <3 FB Neck ROM: Limited    Dental  (+) Chipped   Pulmonary sleep apnea , COPD,  COPD inhaler, Current Smoker,    Pulmonary exam normal breath sounds clear to auscultation       Cardiovascular hypertension, Pt. on medications and Pt. on home beta blockers Normal cardiovascular exam+ dysrhythmias      Neuro/Psych Depression Ulnar nerve lesion    GI/Hepatic GERD  Medicated and Controlled,  Endo/Other  diabetes, Well Controlled, Type 2, Oral Hypoglycemic Agents  Renal/GU   negative genitourinary   Musculoskeletal negative musculoskeletal ROS (+)   Abdominal Normal abdominal exam  (+)   Peds negative pediatric ROS (+)  Hematology  (+) anemia ,   Anesthesia Other Findings   Reproductive/Obstetrics negative OB ROS                             Anesthesia Physical Anesthesia Plan  ASA: III  Anesthesia Plan: General   Post-op Pain Management:    Induction: Intravenous  Airway Management Planned: Oral ETT  Additional Equipment:   Intra-op Plan:   Post-operative Plan: Extubation in OR  Informed Consent: I have reviewed the patients History and Physical, chart, labs and discussed the procedure including the risks, benefits and alternatives for the proposed anesthesia with the patient or authorized representative who has indicated his/her understanding and acceptance.   Dental advisory given  Plan Discussed with: CRNA and Surgeon  Anesthesia Plan Comments:         Anesthesia Quick Evaluation

## 2015-07-18 NOTE — Interval H&P Note (Signed)
History and Physical Interval Note:  07/18/2015 5:25 PM  Robin Huff  has presented today for surgery, with the diagnosis of breast cancer  The various methods of treatment have been discussed with the patient and family. After consideration of risks, benefits and other options for treatment, the patient has consented to  Procedure(s): BREAST LUMPECTOMY WITH SENTINEL LYMPH NODE BX (Left) SENTINEL NODE BIOPSY/NEEDLE LOC (Left) as a surgical intervention .  The patient's history has been reviewed, patient examined, no change in status, stable for surgery.  I have reviewed the patient's chart and labs.  Questions were answered to the patient's satisfaction.     [name] L Anu Stagner

## 2015-07-21 ENCOUNTER — Encounter: Payer: Self-pay | Admitting: Surgery

## 2015-07-21 LAB — SURGICAL PATHOLOGY

## 2015-07-21 NOTE — Anesthesia Postprocedure Evaluation (Signed)
  Anesthesia Post-op Note  Patient: Robin Huff  Procedure(s) Performed: Procedure(s): BREAST LUMPECTOMY WITH SENTINEL LYMPH NODE BX (Left) SENTINEL NODE BIOPSY/NEEDLE LOC (Left)  Anesthesia type:General  Patient location: PACU  Post pain: Pain level controlled  Post assessment: Post-op Vital signs reviewed, Patient's Cardiovascular Status Stable, Respiratory Function Stable, Patent Airway and No signs of Nausea or vomiting  Post vital signs: Reviewed and stable  Last Vitals:  Filed Vitals:   07/18/15 1601  BP: 119/63  Pulse: 75  Temp:   Resp: 16    Level of consciousness: awake, alert  and patient cooperative  Complications: No apparent anesthesia complications

## 2015-07-22 LAB — GLUCOSE, CAPILLARY: GLUCOSE-CAPILLARY: 84 mg/dL (ref 65–99)

## 2015-07-28 ENCOUNTER — Telehealth: Payer: Self-pay | Admitting: Surgery

## 2015-07-28 NOTE — Telephone Encounter (Signed)
Returned patient call. Patient informed that clear margins were found around removed cancer and and all other biopsies were negative for cancer. Patient confirmed understanding of information.

## 2015-07-28 NOTE — Telephone Encounter (Signed)
Results from lumpectomy

## 2015-08-04 ENCOUNTER — Ambulatory Visit (INDEPENDENT_AMBULATORY_CARE_PROVIDER_SITE_OTHER): Payer: Medicare Other | Admitting: Surgery

## 2015-08-04 ENCOUNTER — Ambulatory Visit: Payer: Medicare Other | Admitting: Oncology

## 2015-08-04 ENCOUNTER — Encounter: Payer: Self-pay | Admitting: *Deleted

## 2015-08-04 ENCOUNTER — Encounter: Payer: Self-pay | Admitting: Surgery

## 2015-08-04 VITALS — BP 144/75 | HR 68 | Temp 98.0°F

## 2015-08-04 DIAGNOSIS — C50912 Malignant neoplasm of unspecified site of left female breast: Secondary | ICD-10-CM

## 2015-08-04 NOTE — Patient Instructions (Signed)
Follow up in 6 months with Dr Azalee Course. Our office will call and give you a reminder.

## 2015-08-04 NOTE — Progress Notes (Signed)
Subjective:     Patient ID: Robin Huff, female   DOB: 11/10/1945, 69 y.o.   MRN: [id number]  HPI  69 yr old female with Stage I Left breast CA, s/p Lumptectomy and SLN biopsy on 10/21.  Patient doing well after surgery.  She did have some elevations in her blood sugar directly after surgery, but she increased her insulin dose that night and resolved in the am.  Patient denies any pain in the area now, she only took two pain pills and used naproxen for soreness.  She denies any fever, chills, redness, chest pain, SOB, or drainage from operative site.    Review of Systems  Constitutional: Negative for fever, chills, activity change, appetite change and fatigue.  Respiratory: Negative for cough and shortness of breath.   Cardiovascular: Negative for chest pain, palpitations and leg swelling.  Gastrointestinal: Negative for abdominal pain and abdominal distention.  Hematological: Negative for adenopathy. Does not bruise/bleed easily.  Psychiatric/Behavioral: Negative for agitation. The patient is not nervous/anxious.   All other systems reviewed and are negative.  Filed Vitals:   08/04/15 0922  BP: 144/75  Pulse: 68  Temp: 98 F (36.7 C)       Objective:   Physical Exam  Constitutional: She appears well-developed and well-nourished.  Pulmonary/Chest:  Left Breast: incision site c/d/i, glue still in place, no erythema or edema, no distortion in breast contour, no evidence of seroma  Left axilla: incision c/d/i, no erythema or drainage  Vitals reviewed.      Assessment:     69 yr old s/p Left breast lumpectomy and SLN     Plan:     I explained the surgical pathology results showing 1.3cm IDC with clear margins, three lymph nodes sampled without any malignancy.  I explained to her the tumor markers of ER, PR positive and it would mean she would need tamoxifen for 5 years.  I also explained that it could increase her risk for DVT and endometrial CA slightly but would cut her  risk of breast cancer recurrence or occurrence in other breast in half.   I discussed that she would still need radiation as well.  Patient has discussed this with Dr. Grayland Ormond as well and is expected 6 weeks of radiation treatments.  I will have her f/u in 6 months for breast exam and mammogram.

## 2015-08-08 ENCOUNTER — Inpatient Hospital Stay: Payer: Medicare Other | Attending: Oncology | Admitting: Oncology

## 2015-08-08 VITALS — BP 171/86 | HR 67 | Temp 98.4°F | Resp 20 | Wt 235.5 lb

## 2015-08-08 DIAGNOSIS — Z7982 Long term (current) use of aspirin: Secondary | ICD-10-CM | POA: Insufficient documentation

## 2015-08-08 DIAGNOSIS — D649 Anemia, unspecified: Secondary | ICD-10-CM | POA: Insufficient documentation

## 2015-08-08 DIAGNOSIS — Z915 Personal history of self-harm: Secondary | ICD-10-CM | POA: Insufficient documentation

## 2015-08-08 DIAGNOSIS — C50912 Malignant neoplasm of unspecified site of left female breast: Secondary | ICD-10-CM | POA: Insufficient documentation

## 2015-08-08 DIAGNOSIS — F17210 Nicotine dependence, cigarettes, uncomplicated: Secondary | ICD-10-CM | POA: Diagnosis not present

## 2015-08-08 DIAGNOSIS — D72829 Elevated white blood cell count, unspecified: Secondary | ICD-10-CM | POA: Diagnosis not present

## 2015-08-08 DIAGNOSIS — I499 Cardiac arrhythmia, unspecified: Secondary | ICD-10-CM | POA: Diagnosis not present

## 2015-08-08 DIAGNOSIS — E669 Obesity, unspecified: Secondary | ICD-10-CM | POA: Diagnosis not present

## 2015-08-08 DIAGNOSIS — Z79899 Other long term (current) drug therapy: Secondary | ICD-10-CM | POA: Insufficient documentation

## 2015-08-08 DIAGNOSIS — Z170 Estrogen receptor positive status [ER+]: Secondary | ICD-10-CM

## 2015-08-08 DIAGNOSIS — F329 Major depressive disorder, single episode, unspecified: Secondary | ICD-10-CM | POA: Diagnosis not present

## 2015-08-08 DIAGNOSIS — J449 Chronic obstructive pulmonary disease, unspecified: Secondary | ICD-10-CM | POA: Diagnosis not present

## 2015-08-08 DIAGNOSIS — E785 Hyperlipidemia, unspecified: Secondary | ICD-10-CM | POA: Insufficient documentation

## 2015-08-08 DIAGNOSIS — K219 Gastro-esophageal reflux disease without esophagitis: Secondary | ICD-10-CM | POA: Insufficient documentation

## 2015-08-08 DIAGNOSIS — I10 Essential (primary) hypertension: Secondary | ICD-10-CM

## 2015-08-08 DIAGNOSIS — E538 Deficiency of other specified B group vitamins: Secondary | ICD-10-CM | POA: Diagnosis not present

## 2015-08-08 DIAGNOSIS — E119 Type 2 diabetes mellitus without complications: Secondary | ICD-10-CM | POA: Diagnosis not present

## 2015-08-08 DIAGNOSIS — G4730 Sleep apnea, unspecified: Secondary | ICD-10-CM

## 2015-08-08 NOTE — Progress Notes (Signed)
Patient here today for follow up regarding breast cancer. Patient had surgery 10/21, reports feeling well post op. Patient here today to discuss treatment options, questioning when her radiation will begin.

## 2015-08-15 ENCOUNTER — Ambulatory Visit (INDEPENDENT_AMBULATORY_CARE_PROVIDER_SITE_OTHER): Payer: Medicare Other | Admitting: Surgery

## 2015-08-15 ENCOUNTER — Telehealth: Payer: Self-pay

## 2015-08-15 ENCOUNTER — Telehealth: Payer: Self-pay | Admitting: Surgery

## 2015-08-15 ENCOUNTER — Encounter: Payer: Self-pay | Admitting: Surgery

## 2015-08-15 VITALS — BP 147/83 | HR 68 | Temp 98.1°F

## 2015-08-15 DIAGNOSIS — C50912 Malignant neoplasm of unspecified site of left female breast: Secondary | ICD-10-CM

## 2015-08-15 NOTE — Telephone Encounter (Signed)
Patient called this morning stating that she is having some reddish drainage from her incision site. No fever. Please call.

## 2015-08-15 NOTE — Patient Instructions (Signed)
Follow up at your next appointment on 08/27/15 @1130  in the [place] office with Dr Azalee Course. IF you have any questions, please call our office at your earliest convenience.

## 2015-08-15 NOTE — Telephone Encounter (Signed)
Patient was seen in office this morning due to post-op complication of incision opening. This was dealt with in office and is scheduled for follow-up with Dr. Azalee Course. However, there is an expected delay in radiation to allow wound to heal.  Call was made to patient's nurse navigator, Sheena, at this time to inform her of this information.   No answer. Left voicemail for return phone call from [place], [place].

## 2015-08-15 NOTE — Progress Notes (Signed)
69 yr old female with Stage I Left breast CA, s/p Lumptectomy and SLN biopsy on 10/21, called with complaint of sudden yellowish liquid drainage from breast incision and opening of the site this AM.  She denies any fever, chills, redness, pressure, previous drainage or changes to the wound since last seen on 11/7.  Her blood sugars have been staying in the 120 or below, no other issues.  She denies any trauma to the operative site.    Filed Vitals:   08/15/15 1127  BP: 147/83  Pulse: 68  Temp: 98.1 F (36.7 C)   PE:  Gen: NAD, pleasant Left breast: incision site open with deep stitches in place, glue surrounding incision still on in some places, some irritation on the direct edge of skin but no erythema, healthy appearing tissue beneath site.  Axillary incision c/d/i, no signs of infection, completely healed.     A/P:  Seroma drainage and opening of incision 3 weeks post op.  Unusual to have happen this late after operation but with diabetes and current smoking, ability to heal likely somewhat impaired. Steri strips placed to decrease cosmetic effect, will still be able to drain. Instructed patient to keep clean dry dressing over area and change it at least once daily and when soiled.  She can still shower.  She is to call with any fever, chills, change in drainage to pus, redness or other signs of infection.  Patient understood, otherwise will f/u with wound check on Nov 30 in [facility].

## 2015-08-15 NOTE — Telephone Encounter (Signed)
Robin Huff returned phone call at this time. She has advised Dr. Grayland Ormond of this change and will notify us if there is anything further needed.

## 2015-08-15 NOTE — Telephone Encounter (Signed)
Returned phone call to patient. She states that her breast incision is completely open and that it is draining what sounds like serosanginous drainage. Reports her entire shirt being wet this morning and then when she changed shirt, she noticed the incision being open. Patient states area is slightly red. Denies fever. Has not done anything differently to know why this has occurred.  Called Dr. Azalee Course at this time. She would like to see patient in office in [place].   Patient placed on schedule and told to be at [facility] at 11:30am.

## 2015-08-19 ENCOUNTER — Telehealth: Payer: Self-pay | Admitting: Surgery

## 2015-08-19 NOTE — Telephone Encounter (Signed)
Tape strips came off and wound is open. No bleeding but still draining. No redness or fever.

## 2015-08-19 NOTE — Telephone Encounter (Signed)
Returned patient call. Directed patient to come immediately to the [facility] office and I would reattach the steri strips over her wound. Patient stated that she would come in immediately to the office for reattachment of the steri strips.

## 2015-08-25 ENCOUNTER — Ambulatory Visit
Admission: RE | Admit: 2015-08-25 | Discharge: 2015-08-25 | Disposition: A | Discharge status: Home / Self Care | Payer: Medicare Other | Source: Ambulatory Visit | Attending: Radiation Oncology | Admitting: Radiation Oncology

## 2015-08-25 ENCOUNTER — Encounter: Payer: Self-pay | Admitting: Radiation Oncology

## 2015-08-25 VITALS — BP 149/76 | HR 65 | Temp 96.7°F | Resp 18 | Wt 237.0 lb

## 2015-08-25 DIAGNOSIS — C50912 Malignant neoplasm of unspecified site of left female breast: Secondary | ICD-10-CM

## 2015-08-25 NOTE — Progress Notes (Signed)
[place]  [phone] [phone]) [phone]  ID: Robin Huff OB: [birth year]  MR#: [id number]  [id number]  Patient Care Team: Ezequiel Kayser, MD as PCP - General (Internal Medicine)  CHIEF COMPLAINT:  Chief Complaint  Patient presents with  . Breast Cancer    follow up    INTERVAL HISTORY: Patient returns to clinic today for further evaluation, discussion of her final pathology results, and treatment planning. She is nearly fully recovered from her surgery. She currently feels well and is asymptomatic. She has no neurologic complaints. She denies any recent fevers. She has no chest pain or shortness of breath. She denies any nausea, vomiting, constipation, or diarrhea. She has no urinary complaints. Patient offers no specific complaints today.  REVIEW OF SYSTEMS:   Review of Systems  Constitutional: Negative for fever, weight loss and malaise/fatigue.  Respiratory: Negative.   Cardiovascular: Negative.   Gastrointestinal: Negative.   Genitourinary: Negative.   Musculoskeletal: Negative.   Neurological: Negative.  Negative for weakness.    As per HPI. Otherwise, a complete review of systems is negatve.  PAST MEDICAL HISTORY: Past Medical History  Diagnosis Date  . Cholelithiasis 05/27/2015    Requiring Cholecystectomy 08/14/2014- Dr. Pat Patrick   . BP (high blood pressure) 03/31/2014  . B12 deficiency 04/01/2014  . Acid reflux 03/31/2014  . Diabetes mellitus, type 2 ([place]) 03/31/2014  . Insomnia, persistent 04/01/2014  . 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 Abnormal finding on mammography 11/21/2012    Overview:  Biopsied 11/25/2012 by Dr. Pat Patrick; was due for screening mammogram February 2015.   Marland Kitchen Chronic LBP 05/02/2015  . Clinical depression 03/31/2014    Overview:  S/P suicide attempt 02/1995   . HLD (hyperlipidemia) 03/31/2014  . Apnea, sleep 03/31/2014    Overview:  on CPAP   . Obesity 05/27/2015  . Cancer (HCC)     BREAST  . Dysrhythmia     h/o fluttering occ    . Anemia     h/o  . COPD (chronic obstructive pulmonary disease) ([place])     per cxr on 07-10-15    PAST SURGICAL HISTORY: Past Surgical History  Procedure Laterality Date  . Abdominal hysterectomy      Total  . Laparoscopic cholecystectomy  08/14/2010    Dr. Pat Patrick  . Back surgery      Multiple  . Neck surgery      Multiple  . Knee surgery Left   . Breast biopsy Right 06/20/09    neg  . Breast biopsy Left 12/11/12    neg  . Breast biopsy Left 06/04/2015  . Ulnar nerve repair    . Carpal tunnel release Right   . Breast lumpectomy with sentinel lymph node biopsy Left 07/18/2015    Procedure: BREAST LUMPECTOMY WITH SENTINEL LYMPH NODE BX;  Surgeon: Hubbard Robinson, MD;  Location: [facility] ORS;  Service: General;  Laterality: Left;  . Sentinel node biopsy Left 07/18/2015    Procedure: SENTINEL NODE BIOPSY/NEEDLE LOC;  Surgeon: Hubbard Robinson, MD;  Location: [facility] ORS;  Service: General;  Laterality: Left;    FAMILY HISTORY Family History  Problem Relation Age of Onset  . Hypertension    . Stroke    . Diabetes    . Heart disease    . Hypertension Mother   . Diabetes Brother        ADVANCED DIRECTIVES:    HEALTH MAINTENANCE: Social History  Substance Use Topics  . Smoking status: Current Every Day Smoker -- 1.00  packs/day for 40 years    Types: Cigarettes  . Smokeless tobacco: Never Used  . Alcohol Use: No     Colonoscopy:  PAP:  Bone density:  Lipid panel:  Allergies  Allergen Reactions  . Chlorthalidone Other (See Comments)  . Ciprofloxacin Swelling    Swelling of hands and feet  . Codeine Nausea Only  . Diclofenac     Other reaction(s): Abdominal Pain  . Hydrochlorothiazide     Other reaction(s): Other (See Comments) Leg cramps  . Adhesive [Tape] Rash    IF LEFT ON FOR LONG PERIODS-PAPER TAPE OK TO USE    Current Outpatient Prescriptions  Medication Sig Dispense Refill  . amLODipine (NORVASC) 10 MG tablet Take 1 tablet by mouth every morning.   0   . aspirin EC 81 MG tablet Take 1 tablet by mouth daily.    Marland Kitchen atorvastatin (LIPITOR) 20 MG tablet Take 1 tablet by mouth at bedtime.    . carvedilol (COREG) 25 MG tablet Take 1 tablet by mouth 2 (two) times daily.    . cetirizine (ZYRTEC) 10 MG tablet Take 1 tablet by mouth daily.    . Cholecalciferol (D 5000) 5000 UNITS TABS Take 1 Dose by mouth 1 day or 1 dose.    . Cholecalciferol (D-5000) 5000 UNITS TABS Take 1 tablet by mouth daily.    . cyanocobalamin (V-R VITAMIN B-12) 500 MCG tablet Take 1 tablet by mouth daily.    . insulin NPH-regular Human (NOVOLIN 70/30) (70-30) 100 UNIT/ML injection Inject 15-20 Units into the skin 2 (two) times daily. 15 units in am and 20 units in pm    . lisinopril (PRINIVIL,ZESTRIL) 40 MG tablet Take 1 tablet by mouth every morning.   0  . Multiple Vitamin (MULTIVITAMIN) capsule Take 1 capsule by mouth daily.    . naproxen sodium (RA NAPROXEN SODIUM) 220 MG tablet Take 1 tablet by mouth 2 (two) times daily.    . Omega-3 Fatty Acids (FISH OIL PO) Take 1 tablet by mouth at bedtime.    Marland Kitchen omeprazole (PRILOSEC) 20 MG capsule Take 1 capsule by mouth every morning.     . traZODone (DESYREL) 150 MG tablet Take 1 tablet by mouth at bedtime.    Marland Kitchen ZOSTAVAX 60454 UNT/0.65ML injection     . oxyCODONE-acetaminophen (ROXICET) 5-325 MG tablet Take 1-2 tablets by mouth every 4 (four) hours as needed for severe pain. (Patient not taking: Reported on 08/25/2015) 30 tablet 0   No current facility-administered medications for this visit.    OBJECTIVE: Filed Vitals:   08/08/15 1059  BP: 171/86  Pulse: 67  Temp: 98.4 F (36.9 C)  Resp: 20     Body mass index is 37.44 kg/(m^2).    ECOG FS:0 - Asymptomatic  General: Well-developed, well-nourished, no acute distress. Eyes: Pink conjunctiva, anicteric sclera. Breasts: Left breast with no palpable lumps or masses. Exam deferred today. Lungs: Clear to auscultation bilaterally. Heart: Regular rate and rhythm. No rubs, murmurs,  or gallops. Abdomen: Soft, nontender, nondistended. No organomegaly noted, normoactive bowel sounds. Musculoskeletal: No edema, cyanosis, or clubbing. Neuro: Alert, answering all questions appropriately. Cranial nerves grossly intact. Skin: No rashes or petechiae noted. Psych: Normal affect.   LAB RESULTS:  Lab Results  Component Value Date   NA 138 07/10/2015   K 4.0 07/10/2015   CL 105 07/10/2015   CO2 27 07/10/2015   GLUCOSE 91 07/10/2015   BUN 18 07/10/2015   CREATININE 0.60 07/10/2015   CALCIUM 8.6* 07/10/2015  PROT 7.3 07/10/2015   ALBUMIN 3.6 07/10/2015   AST 17 07/10/2015   ALT 16 07/10/2015   ALKPHOS 62 07/10/2015   BILITOT 0.6 07/10/2015   GFRNONAA >60 07/10/2015   GFRAA >60 07/10/2015    Lab Results  Component Value Date   WBC 12.2* 07/10/2015   NEUTROABS 8.7* 07/10/2015   HGB 14.6 07/10/2015   HCT 44.4 07/10/2015   MCV 96.4 07/10/2015   PLT 216 07/10/2015     STUDIES: No results found.  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  PLAN:    1. Breast cancer: Final pathology results noted reviewed independently. Patient had low risk Oncotype score, therefore will be given a referral to radiation oncology for consideration of adjuvant XRT. Patient will follow-up at the end of her XRT for further evaluation and initiation of an aromatase inhibitor.  2. Leukocytosis: Likely reactive, monitor.  Patient will return to clinic Patient expressed understanding and was in agreement with this plan. She also understands that She can call clinic at any time with any questions, concerns, or complaints.   Breast cancer, stage 1 ([place])   Staging form: Breast, AJCC 7th Edition     Clinical stage from 07/02/2015: Stage IA (T1c, N0, M0) - Signed by Lloyd Huger, MD on 07/02/2015   Lloyd Huger, MD   08/25/2015 11:20 PM

## 2015-08-25 NOTE — Consult Note (Signed)
Except an outstanding is perfect of Radiation Oncology NEW PATIENT EVALUATION  Name: Robin Huff  MRN: [id number]  Date:   08/25/2015     DOB: 03/26/1946   This 69 y.o. female patient presents to the clinic for initial evaluation of breast cancer stage Ia (T1 CN 0 M0) ER/PR positive status post wide local excision and sentinel node biopsy.  REFERRING PHYSICIAN: Ezequiel Kayser, MD  CHIEF COMPLAINT:  Chief Complaint  Patient presents with  . Breast Cancer    Pt is here for initial consultation of breast cancer.  Her incision is still draining some and she has an appt. to go back to surgeon on 11/30.      DIAGNOSIS: The encounter diagnosis was Malignant neoplasm of left female breast, unspecified site of breast ([place]).   PREVIOUS INVESTIGATIONS:  Mammograms ultrasound reviewed Surgical pathology report reviewed Clinical notes reviewed  HPI: Patient is a 69 year old female who presented with an abnormal mammogram showing a irregular mass that was confirmed on ultrasound to measure approximate 1 cm at the 12:00 position. She went on to have a needle localization which was positive for invasive mammary carcinoma. She underwent a wide local excision and sentinel node biopsy showing a 1.3 grade 1 invasive mammary carcinoma with margins clear. 3 sentinel lymph nodes were all negative for metastatic disease. Tumor was strongly ER/PR positive HER-2/neu not overexpressed. She would not have a Oncotype DX which I don't have the official report although according to patient through medical oncology she had a very low risk of recurrence. She is scheduled to have aromatase inhibitor therapy after whole breast radiation. She did have a complication about 3 weeks after her surgery with seroma and incision opening she's had a repaired at this time is doing well with no further drainage. She specifically denies breast tenderness cough or bone pain. She does have a history of adult-onset diabetes as well as  chronic smoker which may have something to do with her wound problems.  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    PAST SURGICAL HISTORY:  Past Surgical History  Procedure Laterality Date  . Abdominal hysterectomy      Total  . Laparoscopic cholecystectomy  08/14/2010    Dr. Pat Patrick  . Back surgery      Multiple  . Neck surgery      Multiple  . Knee surgery Left   . Breast biopsy Right 06/20/09    neg  . Breast biopsy Left 12/11/12    neg  . Breast biopsy Left 06/04/2015  . Ulnar nerve repair    . Carpal tunnel release Right   . Breast lumpectomy with sentinel lymph node biopsy Left 07/18/2015    Procedure: BREAST LUMPECTOMY WITH SENTINEL LYMPH NODE BX;  Surgeon: Hubbard Robinson, MD;  Location: [facility] ORS;  Service: General;  Laterality: Left;  . Sentinel node biopsy Left 07/18/2015    Procedure: SENTINEL NODE BIOPSY/NEEDLE LOC;  Surgeon: Hubbard Robinson, MD;  Location: [facility] ORS;  Service: General;  Laterality: Left;    FAMILY HISTORY: family history includes Diabetes in her brother and another family member;  Heart disease in an other family member; Hypertension in her mother and another family member; Stroke in an other family member.  SOCIAL HISTORY:  reports that she has been smoking Cigarettes.  She has a 40 pack-year smoking history. She has never used smokeless tobacco. She reports that she does not drink alcohol or use illicit  drugs.  ALLERGIES: Chlorthalidone; Ciprofloxacin; Codeine; Diclofenac; Hydrochlorothiazide; and Adhesive  MEDICATIONS:  Current Outpatient Prescriptions  Medication Sig Dispense Refill  . amLODipine (NORVASC) 10 MG tablet Take 1 tablet by mouth every morning.   0  . aspirin EC 81 MG tablet Take 1 tablet by mouth daily.    Marland Kitchen atorvastatin (LIPITOR) 20 MG tablet Take 1 tablet by mouth at bedtime.    . carvedilol (COREG) 25 MG tablet Take 1 tablet by mouth 2 (two) times daily.    . cetirizine (ZYRTEC) 10 MG tablet Take 1 tablet by mouth daily.    . Cholecalciferol (D 5000) 5000 UNITS TABS Take 1 Dose by mouth 1 day or 1 dose.    . Cholecalciferol (D-5000) 5000 UNITS TABS Take 1 tablet by mouth daily.    . cyanocobalamin (V-R VITAMIN B-12) 500 MCG tablet Take 1 tablet by mouth daily.    . insulin NPH-regular Human (NOVOLIN 70/30) (70-30) 100 UNIT/ML injection Inject 15-20 Units into the skin 2 (two) times daily. 15 units in am and 20 units in pm    . lisinopril (PRINIVIL,ZESTRIL) 40 MG tablet Take 1 tablet by mouth every morning.   0  . Multiple Vitamin (MULTIVITAMIN) capsule Take 1 capsule by mouth daily.    . naproxen sodium (RA NAPROXEN SODIUM) 220 MG tablet Take 1 tablet by mouth 2 (two) times daily.    . Omega-3 Fatty Acids (FISH OIL PO) Take 1 tablet by mouth at bedtime.    Marland Kitchen omeprazole (PRILOSEC) 20 MG capsule Take 1 capsule by mouth every morning.     . traZODone (DESYREL) 150 MG tablet Take 1 tablet by mouth at bedtime.    Marland Kitchen ZOSTAVAX 54098 UNT/0.65ML injection     . oxyCODONE-acetaminophen (ROXICET) 5-325 MG tablet Take 1-2 tablets by mouth every 4 (four) hours as needed for severe pain. (Patient not taking: Reported on 08/25/2015) 30 tablet 0   No current facility-administered medications for this encounter.    ECOG PERFORMANCE STATUS:  0 - Asymptomatic  REVIEW OF SYSTEMS:  Patient denies any weight loss, fatigue, weakness, fever, chills or night sweats. Patient denies any loss of  vision, blurred vision. Patient denies any ringing  of the ears or hearing loss. No irregular heartbeat. Patient denies heart murmur or history of fainting. Patient denies any chest pain or pain radiating to her upper extremities. Patient denies any shortness of breath, difficulty breathing at night, cough or hemoptysis. Patient denies any swelling in the lower legs. Patient denies any nausea vomiting, vomiting of blood, or coffee ground material in the vomitus. Patient denies any stomach pain. Patient states has had normal bowel movements no significant constipation or diarrhea. Patient denies any dysuria, hematuria or significant nocturia. Patient denies any problems walking, swelling in the joints or loss of balance. Patient denies any skin changes, loss of hair or loss of weight. Patient denies any excessive worrying or anxiety or significant depression. Patient denies any problems with insomnia. Patient denies excessive thirst, polyuria, polydipsia. Patient denies any swollen glands, patient denies easy bruising or easy bleeding. Patient denies any recent infections, allergies or URI. Patient "s visual fields have not changed significantly  in recent time.    PHYSICAL EXAM: BP 149/76 mmHg  Pulse 65  Temp(Src) 96.7 F (35.9 C)  Resp 18  Wt 236 lb 15.9 oz (107.5 kg) Well-developed slightly obese female in NAD. Breasts are large and pendulous. She is status post wide local excision of left breast and sentinel node biopsy site both healing well. No dominant mass or nodularity is noted in either breast in 2 positions examined. Lungs are clear to A&P cardiac examination shows regular rate and rhythm. Well-developed well-nourished patient in NAD. HEENT reveals PERLA, EOMI, discs not visualized.  Oral cavity is clear. No oral mucosal lesions are identified. Neck is clear without evidence of cervical or supraclavicular adenopathy. Lungs are clear to A&P. Cardiac examination is essentially unremarkable with  regular rate and rhythm without murmur rub or thrill. Abdomen is benign with no organomegaly or masses noted. Motor sensory and DTR levels are equal and symmetric in the upper and lower extremities. Cranial nerves II through XII are grossly intact. Proprioception is intact. No peripheral adenopathy or edema is identified. No motor or sensory levels are noted. Crude visual fields are within normal range.  LABORATORY DATA: Surgical pathology reports reviewed    RADIOLOGY RESULTS: Mammograms and ultrasound reviewed   IMPRESSION: Stage I invasive mammary carcinoma ER/PR positive of the left breast as was wide local excision and sentinel node biopsy in 69 year old female  PLAN: At this time I have recommended whole breast radiation she has rather large pendulous breasts making hypofractionated course of treatment difficult. I will plan on delivering 5000 cGy over 5 weeks boosting her scar another 1400 cGy using electron beam. Risks and benefits of treatment including skin reaction, fatigue, alteration of blood counts, possible inclusion of superficial lung all were discussed in detail with the patient and her daughter. I have set up and ordered CT simulation next week to allow slightly more healing of her breast. Patient will also be candidate for aromatase inhibitor therapy after completion of treatment and that will be discussed with medical oncology.  I would like to take this opportunity for allowing me to participate in the care of your patient.Armstead Peaks., MD

## 2015-08-27 ENCOUNTER — Ambulatory Visit (INDEPENDENT_AMBULATORY_CARE_PROVIDER_SITE_OTHER): Payer: Medicare Other | Admitting: Surgery

## 2015-08-27 ENCOUNTER — Encounter: Payer: Self-pay | Admitting: Surgery

## 2015-08-27 VITALS — BP 146/75 | HR 69 | Temp 98.3°F | Ht 67.0 in | Wt 234.8 lb

## 2015-08-27 DIAGNOSIS — F17200 Nicotine dependence, unspecified, uncomplicated: Secondary | ICD-10-CM

## 2015-08-27 DIAGNOSIS — C50212 Malignant neoplasm of upper-inner quadrant of left female breast: Secondary | ICD-10-CM

## 2015-08-27 DIAGNOSIS — C50912 Malignant neoplasm of unspecified site of left female breast: Secondary | ICD-10-CM

## 2015-08-27 NOTE — Progress Notes (Signed)
69 yr old female s/p left breast lumpectomy with opening of the incision three weeks out.  Still healing and draining today despite attempting closure with steristrips.    Filed Vitals:   08/27/15 1130  BP: 146/75  Pulse: 69  Temp: 98.3 F (36.8 C)    PE:  GEn: NAD Res: expiratory wheezing Cardio: RRR Left breast: incision site with fibrinous material on base, cleaned with gauze and packed with wet to dry.  A/p:  Taught patient to pack with wet to dry BID.  Also discussed smoking cessation, she is willing to try and is going to use nicotine gum to help with cravings.  Will see her back in clinic in 2 weeks for wound check.

## 2015-08-27 NOTE — Patient Instructions (Signed)
Work on decreasing and eventually Economy your smoking. You may try Chantix or Nicotine gum.  You will need to pack this wound twice daily, every day to get this to heal. This will take weeks until this is completely healed but will heal much better when you stop your smoking.  You may take showers, pull the packing out, take your shower, and then repack with saline covered guaze and then dry guaze and tape.

## 2015-09-01 ENCOUNTER — Ambulatory Visit
Admission: RE | Admit: 2015-09-01 | Discharge: 2015-09-01 | Disposition: A | Discharge status: Home / Self Care | Payer: Medicare Other | Source: Ambulatory Visit | Attending: Radiation Oncology | Admitting: Radiation Oncology

## 2015-09-01 ENCOUNTER — Ambulatory Visit: Admission: RE | Admit: 2015-09-01 | Payer: Medicare Other | Source: Ambulatory Visit

## 2015-09-12 ENCOUNTER — Telehealth: Payer: Self-pay | Admitting: *Deleted

## 2015-09-12 NOTE — Telephone Encounter (Signed)
Called patient to follow up with her.  She has not been to start radiation treatment due to delayed surgical healing.  States she is still packing her wound twice a day.  She has a follow up appointment on Monday.  Needed assistance with finding supplies.  Encouraged her to call [facility].  She is to call if she has any questions or needs.

## 2015-09-15 ENCOUNTER — Encounter: Payer: Self-pay | Admitting: General Surgery

## 2015-09-15 ENCOUNTER — Ambulatory Visit (INDEPENDENT_AMBULATORY_CARE_PROVIDER_SITE_OTHER): Payer: Medicare Other | Admitting: General Surgery

## 2015-09-15 VITALS — Temp 97.7°F | Ht 67.0 in | Wt 230.0 lb

## 2015-09-15 DIAGNOSIS — Z4889 Encounter for other specified surgical aftercare: Secondary | ICD-10-CM

## 2015-09-15 NOTE — Progress Notes (Signed)
Outpatient Surgical Follow Up  09/15/2015  Robin Huff is an 69 y.o. female.   Chief Complaint  Patient presents with  . Routine Post Op    Left Lumpectomy 07/18/2015    HPI:  69 year old female returns to clinic for two-week follow-up of open wound from a left lumpectomy was performed in October. The wound has completely opened up and is very slow to heal. There is no evidence of spreading erythema or purulent drainage. Patient otherwise doing well. Denies any pain, fevers, chills, nausea, vomiting.  Past Medical History  Diagnosis Date  . Cholelithiasis 05/27/2015    Requiring Cholecystectomy 08/14/2014- Dr. Pat Patrick   . BP (high blood pressure) 03/31/2014  . B12 deficiency 04/01/2014  . Acid reflux 03/31/2014  . Diabetes mellitus, type 2 ([place]) 03/31/2014  . Insomnia, persistent 04/01/2014  . 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 Abnormal finding on mammography 11/21/2012    Overview:  Biopsied 11/25/2012 by Dr. Pat Patrick; was due for screening mammogram February 2015.   Marland Kitchen Chronic LBP 05/02/2015  . Clinical depression 03/31/2014    Overview:  S/P suicide attempt 02/1995   . HLD (hyperlipidemia) 03/31/2014  . Apnea, sleep 03/31/2014    Overview:  on CPAP   . Obesity 05/27/2015  . Cancer ([facility])     BREAST  . Dysrhythmia     h/o fluttering occ  . Anemia     h/o  . COPD (chronic obstructive pulmonary disease) ([place])     per cxr on 07-10-15    Past Surgical History  Procedure Laterality Date  . Abdominal hysterectomy      Total  . Laparoscopic cholecystectomy  08/14/2010    Dr. Pat Patrick  . Back surgery      Multiple  . Neck surgery      Multiple  . Knee surgery Left   . Breast biopsy Right 06/20/09    neg  . Breast biopsy Left 12/11/12    neg  . Breast biopsy Left 06/04/2015  . Ulnar nerve repair    . Carpal tunnel release Right   . Breast lumpectomy with sentinel lymph node biopsy Left 07/18/2015    Procedure: BREAST LUMPECTOMY WITH SENTINEL LYMPH NODE BX;  Surgeon: Hubbard Robinson, MD;  Location:  [facility] ORS;  Service: General;  Laterality: Left;  . Sentinel node biopsy Left 07/18/2015    Procedure: SENTINEL NODE BIOPSY/NEEDLE LOC;  Surgeon: Hubbard Robinson, MD;  Location: [facility] ORS;  Service: General;  Laterality: Left;    Family History  Problem Relation Age of Onset  . Hypertension    . Stroke    . Diabetes    . Heart disease    . Hypertension Mother   . Diabetes Brother     Social History:  reports that she quit smoking 8 days ago. Her smoking use included Cigarettes. She has a 40 pack-year smoking history. She has never used smokeless tobacco. She reports that she does not drink alcohol or use illicit drugs.  Allergies:  Allergies  Allergen Reactions  . Chlorthalidone Other (See Comments)  . Ciprofloxacin Swelling    Swelling of hands and feet  . Codeine Nausea Only  . Diclofenac     Other reaction(s): Abdominal Pain  . Hydrochlorothiazide     Other reaction(s): Other (See Comments) Leg cramps  . Adhesive [Tape] Rash    IF LEFT ON FOR LONG PERIODS-PAPER TAPE OK TO USE    Medications reviewed.    ROS   a multipoint review of systems was  completed. All pertinent positives and negatives in the history of present illness the remainder negative.  Temp(Src) 97.7 F (36.5 C) (Oral)  Ht 5\' 7"  (1.702 m)  Wt 104.327 kg (230 lb)  BMI 36.01 kg/m2  Physical Exam   Gen.: No acute distress Chest: Clear to auscultation Breast: open wound to the left breast measuring 3 x 2 x 2 cm. No erythema or purulence. Heart: Regular rhythm Abdomen: Soft, nontender and nondistended   No results found for this or any previous visit (from the past 48 hour(s)). No results found.  Assessment/Plan:  1. Aftercare following surgery  69 year old female with open wound from lumpectomy. Continues wet-to-dry dressings. Otherwise doing well. We'll have her follow-up in 1 week to evaluate whether not a  Delayed secondary closure could be attempted.     Clayburn Pert, MD  FACS General Surgeon  09/15/2015,3:23 PM

## 2015-09-15 NOTE — Patient Instructions (Signed)
Please give [place] a call once you find out when your appointment would be on December 29 th.

## 2015-09-23 ENCOUNTER — Ambulatory Visit: Payer: Medicare Other

## 2015-09-25 ENCOUNTER — Ambulatory Visit (INDEPENDENT_AMBULATORY_CARE_PROVIDER_SITE_OTHER): Payer: Medicare Other | Admitting: General Surgery

## 2015-09-25 ENCOUNTER — Encounter: Payer: Self-pay | Admitting: General Surgery

## 2015-09-25 VITALS — BP 188/99 | HR 67 | Temp 97.9°F | Wt 235.0 lb

## 2015-09-25 DIAGNOSIS — Z4889 Encounter for other specified surgical aftercare: Secondary | ICD-10-CM

## 2015-09-25 DIAGNOSIS — C50912 Malignant neoplasm of unspecified site of left female breast: Secondary | ICD-10-CM

## 2015-09-25 DIAGNOSIS — R29898 Other symptoms and signs involving the musculoskeletal system: Secondary | ICD-10-CM | POA: Insufficient documentation

## 2015-09-25 DIAGNOSIS — M25559 Pain in unspecified hip: Secondary | ICD-10-CM | POA: Insufficient documentation

## 2015-09-25 NOTE — Patient Instructions (Signed)
We will see you at the operating room on 10/01/2015.

## 2015-09-25 NOTE — Progress Notes (Signed)
Outpatient Surgical Follow Up  09/25/2015  Robin Huff is an 69 y.o. female.   Chief Complaint  Patient presents with  . Routine Post Op    Left Lumpectomy 07/18/2015 Dr. Azalee Course    HPI: 69 year old female well-known the surgery department returns follow-up of a left breast wound. Wound was created on October 21 for treatment of her stage I breast cancer. She knows that it continues to have some pink drainage on her daily dressing changes. It is not changed in size at all. She is very concerned due to this lack of progress as she is still in need of radiation therapy for her breast cancer. She denies any fevers, chills, nausea, vomiting, diarrhea, constipation, chest pain, short of breath. She states she's not an active smoker but smells heavily of cigarette smoke.  Past Medical History  Diagnosis Date  . Cholelithiasis 05/27/2015    Requiring Cholecystectomy 08/14/2014- Dr. Pat Patrick   . BP (high blood pressure) 03/31/2014  . B12 deficiency 04/01/2014  . Acid reflux 03/31/2014  . Diabetes mellitus, type 2 ([place]) 03/31/2014  . Insomnia, persistent 04/01/2014  . 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 Abnormal finding on mammography 11/21/2012    Overview:  Biopsied 11/25/2012 by Dr. Pat Patrick; was due for screening mammogram February 2015.   Marland Kitchen Chronic LBP 05/02/2015  . Clinical depression 03/31/2014    Overview:  S/P suicide attempt 02/1995   . HLD (hyperlipidemia) 03/31/2014  . Apnea, sleep 03/31/2014    Overview:  on CPAP   . Obesity 05/27/2015  . Cancer (H[facility])     BREAST  . Dysrhythmia     h/o fluttering occ  . Anemia     h/o  . COPD (chronic obstructive pulmonary disease) ([place])     per cxr on 07-10-15    Past Surgical History  Procedure Laterality Date  . Abdominal hysterectomy      Total  . Laparoscopic cholecystectomy  08/14/2010    Dr. Pat Patrick  . Back surgery      Multiple  . Neck surgery      Multiple  . Knee surgery Left   . Breast biopsy Right 06/20/09    neg  . Breast biopsy Left 12/11/12    neg   . Breast biopsy Left 06/04/2015  . Ulnar nerve repair    . Carpal tunnel release Right   . Breast lumpectomy with sentinel lymph node biopsy Left 07/18/2015    Procedure: BREAST LUMPECTOMY WITH SENTINEL LYMPH NODE BX;  Surgeon: Hubbard Robinson, MD;  Location: [facility] ORS;  Service: General;  Laterality: Left;  . Sentinel node biopsy Left 07/18/2015    Procedure: SENTINEL NODE BIOPSY/NEEDLE LOC;  Surgeon: Hubbard Robinson, MD;  Location: [facility] ORS;  Service: General;  Laterality: Left;    Family History  Problem Relation Age of Onset  . Hypertension    . Stroke    . Diabetes    . Heart disease    . Hypertension Mother   . Diabetes Brother     Social History:  reports that she quit smoking about 2 weeks ago. Her smoking use included Cigarettes. She has a 40 pack-year smoking history. She has never used smokeless tobacco. She reports that she does not drink alcohol or use illicit drugs.  Allergies:  Allergies  Allergen Reactions  . Chlorthalidone Other (See Comments)  . Ciprofloxacin Swelling    Swelling of hands and feet  . Codeine Nausea Only  . Diclofenac     Other reaction(s): Abdominal  Pain  . Hydrochlorothiazide     Other reaction(s): Other (See Comments) Leg cramps  . Adhesive [Tape] Rash    IF LEFT ON FOR LONG PERIODS-PAPER TAPE OK TO USE    Medications reviewed.    ROS  A multipoint review of systems was completed. All pertinent positives and negatives were documented within the history of present illness remainder are negative.  BP 188/99 mmHg  Pulse 67  Temp(Src) 97.9 F (36.6 C)  Wt 106.595 kg (235 lb)  Physical Exam  Gen.: No acute distress Chest: Clear to auscultation Heart: Regular rate and rhythm Breast: Left breast examined with a open wound at the 11:00 position from the nipple areolar complex this wound measures 3 x 2 x 2 cm. It is unchanged from previous examinations. No evidence of erythema or purulent drainage. Abdomen: Soft, nontender,  nondistended   No results found for this or any previous visit (from the past 48 hour(s)). No results found.  Assessment/Plan:  1. Aftercare following surgery Wound remains uninfected but continues to be open without any evidence of healing.  2. Breast cancer, stage 1, left ([place]) Patient is status post lumpectomy which is the cause of her current wound. She is in need of radiation therapy to complete her cancer treatment however radiation cannot be started until she has close skin. Discussed the option of doing a reexcision and closure of the wound. She voiced a desire to do so so that she can get this healed and start radiation therapy. We will plan for treatment the operating room next week to debride the wound and close it again this time with a nylon suture and set of a subcutaneous suture. Patient voiced understanding wished proceed. Plan for surgery on 10/01/2015.     Clayburn Pert, MD FACS General Surgeon  09/25/2015,11:37 AM

## 2015-09-26 ENCOUNTER — Telehealth: Payer: Self-pay | Admitting: General Surgery

## 2015-09-26 ENCOUNTER — Ambulatory Visit: Payer: Medicare Other

## 2015-09-26 ENCOUNTER — Other Ambulatory Visit: Payer: Medicare Other

## 2015-09-26 NOTE — Telephone Encounter (Signed)
Pt advised of pre op date/time and sx date. Sx: 10/01/15 with Dr Chesley Mires Debridement and CLosure of left breast.  Pre op: 09/26/15 between 1-5pm--phone.

## 2015-09-26 NOTE — Patient Instructions (Signed)
  Your procedure is scheduled on: 10-01-15 [facility]) Report to [place] To find out your arrival time please call [phone] between 1PM - 3PM on 09-30-15 (TUESDAY)  Remember: Instructions that are not followed completely may result in serious medical risk, up to and including death, or upon the discretion of your surgeon and anesthesiologist your surgery may need to be rescheduled.    _X___ 1. Do not eat food or drink liquids after midnight. No gum chewing or hard candies.     _X___ 2. No Alcohol for 24 hours before or after surgery.   ____ 3. Bring all medications with you on the day of surgery if instructed.    _X___ 4. Notify your doctor if there is any change in your medical condition     (cold, fever, infections).     Do not wear jewelry, make-up, hairpins, clips or nail polish.  Do not wear lotions, powders, or perfumes. You may wear deodorant.  Do not shave 48 hours prior to surgery. Men may shave face and neck.  Do not bring valuables to the hospital.    [facility] is not responsible for any belongings or valuables.               Contacts, dentures or bridgework may not be worn into surgery.  Leave your suitcase in the car. After surgery it may be brought to your room.  For patients admitted to the hospital, discharge time is determined by your treatment team.   Patients discharged the day of surgery will not be allowed to drive home.   Please read over the following fact sheets that you were given:      _X___ Take these medicines the morning of surgery with A SIP OF WATER:    1. AMLODIPINE  2. CARVEDILOL  3. LISINOPRIL  4. PRILOSEC  5. TAKE AN EXTRA PRILOSEC Tuesday NIGHT  6.  ____ Fleet Enema (as directed)   ____ Use CHG Soap as directed  ____ Use inhalers on the day of surgery  ____ Stop metformin 2 days prior to surgery    ____ Take 1/2 of usual insulin dose the night before surgery and none on the morning of surgery.    ____ Stop Coumadin/Plavix/aspirin-STOP ASA NOW  __X__ Stop Anti-inflammatories-STOP NAPROXEN NOW-NO NSAIDS OR ASA PRODUCTS-TYLENOL OK   __X__ Stop supplements until after surgery-STOP FISH OIL NOW   ____ Bring C-Pap to the hospital.

## 2015-09-30 ENCOUNTER — Other Ambulatory Visit: Payer: Self-pay | Admitting: Unknown Physician Specialty

## 2015-09-30 DIAGNOSIS — R29898 Other symptoms and signs involving the musculoskeletal system: Secondary | ICD-10-CM

## 2015-10-01 ENCOUNTER — Ambulatory Visit
Admission: RE | Admit: 2015-10-01 | Discharge: 2015-10-01 | Disposition: A | Discharge status: Home / Self Care | Payer: Medicare Other | Source: Ambulatory Visit | Attending: General Surgery | Admitting: General Surgery

## 2015-10-01 ENCOUNTER — Ambulatory Visit: Payer: Medicare Other | Admitting: Anesthesiology

## 2015-10-01 ENCOUNTER — Encounter: Payer: Self-pay | Admitting: *Deleted

## 2015-10-01 ENCOUNTER — Encounter
Admission: RE | Disposition: A | Discharge status: Home / Self Care | Payer: Self-pay | Source: Ambulatory Visit | Attending: General Surgery

## 2015-10-01 DIAGNOSIS — F17210 Nicotine dependence, cigarettes, uncomplicated: Secondary | ICD-10-CM | POA: Insufficient documentation

## 2015-10-01 DIAGNOSIS — C50912 Malignant neoplasm of unspecified site of left female breast: Secondary | ICD-10-CM | POA: Diagnosis not present

## 2015-10-01 DIAGNOSIS — F329 Major depressive disorder, single episode, unspecified: Secondary | ICD-10-CM | POA: Diagnosis not present

## 2015-10-01 DIAGNOSIS — G4730 Sleep apnea, unspecified: Secondary | ICD-10-CM | POA: Insufficient documentation

## 2015-10-01 DIAGNOSIS — Z885 Allergy status to narcotic agent status: Secondary | ICD-10-CM | POA: Insufficient documentation

## 2015-10-01 DIAGNOSIS — Z833 Family history of diabetes mellitus: Secondary | ICD-10-CM | POA: Diagnosis not present

## 2015-10-01 DIAGNOSIS — Z9049 Acquired absence of other specified parts of digestive tract: Secondary | ICD-10-CM | POA: Diagnosis not present

## 2015-10-01 DIAGNOSIS — I10 Essential (primary) hypertension: Secondary | ICD-10-CM | POA: Insufficient documentation

## 2015-10-01 DIAGNOSIS — E669 Obesity, unspecified: Secondary | ICD-10-CM | POA: Diagnosis not present

## 2015-10-01 DIAGNOSIS — Z881 Allergy status to other antibiotic agents status: Secondary | ICD-10-CM | POA: Insufficient documentation

## 2015-10-01 DIAGNOSIS — E538 Deficiency of other specified B group vitamins: Secondary | ICD-10-CM | POA: Insufficient documentation

## 2015-10-01 DIAGNOSIS — G4709 Other insomnia: Secondary | ICD-10-CM | POA: Diagnosis not present

## 2015-10-01 DIAGNOSIS — I499 Cardiac arrhythmia, unspecified: Secondary | ICD-10-CM | POA: Diagnosis not present

## 2015-10-01 DIAGNOSIS — E785 Hyperlipidemia, unspecified: Secondary | ICD-10-CM | POA: Diagnosis not present

## 2015-10-01 DIAGNOSIS — T8131XA Disruption of external operation (surgical) wound, not elsewhere classified, initial encounter: Secondary | ICD-10-CM | POA: Diagnosis not present

## 2015-10-01 DIAGNOSIS — K219 Gastro-esophageal reflux disease without esophagitis: Secondary | ICD-10-CM | POA: Insufficient documentation

## 2015-10-01 DIAGNOSIS — Z8249 Family history of ischemic heart disease and other diseases of the circulatory system: Secondary | ICD-10-CM | POA: Insufficient documentation

## 2015-10-01 DIAGNOSIS — Z90710 Acquired absence of both cervix and uterus: Secondary | ICD-10-CM | POA: Insufficient documentation

## 2015-10-01 DIAGNOSIS — Z823 Family history of stroke: Secondary | ICD-10-CM | POA: Insufficient documentation

## 2015-10-01 DIAGNOSIS — J449 Chronic obstructive pulmonary disease, unspecified: Secondary | ICD-10-CM | POA: Insufficient documentation

## 2015-10-01 DIAGNOSIS — Z91048 Other nonmedicinal substance allergy status: Secondary | ICD-10-CM | POA: Diagnosis not present

## 2015-10-01 DIAGNOSIS — M545 Low back pain: Secondary | ICD-10-CM | POA: Insufficient documentation

## 2015-10-01 DIAGNOSIS — D649 Anemia, unspecified: Secondary | ICD-10-CM | POA: Insufficient documentation

## 2015-10-01 DIAGNOSIS — Z853 Personal history of malignant neoplasm of breast: Secondary | ICD-10-CM | POA: Diagnosis not present

## 2015-10-01 DIAGNOSIS — E119 Type 2 diabetes mellitus without complications: Secondary | ICD-10-CM | POA: Diagnosis not present

## 2015-10-01 HISTORY — PX: DEBRIDEMENT AND CLOSURE WOUND: SHX5614

## 2015-10-01 LAB — GLUCOSE, CAPILLARY
Glucose-Capillary: 80 mg/dL (ref 65–99)
Glucose-Capillary: 73 mg/dL (ref 65–99)

## 2015-10-01 SURGERY — DEBRIDEMENT, WOUND, WITH CLOSURE
Anesthesia: General | Laterality: Left | Wound class: Clean Contaminated

## 2015-10-01 MED ORDER — HYDROCODONE-ACETAMINOPHEN 5-325 MG PO TABS
1.0000 | ORAL_TABLET | Freq: Four times a day (QID) | ORAL | Status: DC | PRN
  Administered 2015-10-01: 1 via ORAL

## 2015-10-01 MED ORDER — CHLORHEXIDINE GLUCONATE 4 % EX LIQD: 1.0000 "application " | Freq: Once | CUTANEOUS | Status: DC

## 2015-10-01 MED ORDER — HYDROCODONE-ACETAMINOPHEN 5-325 MG PO TABS: 1.0000 | ORAL_TABLET | Freq: Four times a day (QID) | ORAL | Status: DC | PRN

## 2015-10-01 MED ORDER — EPHEDRINE SULFATE 50 MG/ML IJ SOLN
INTRAMUSCULAR | Status: DC | PRN
  Administered 2015-10-01 (×3): 10 mg via INTRAVENOUS

## 2015-10-01 MED ORDER — FENTANYL CITRATE (PF) 100 MCG/2ML IJ SOLN: 25.0000 ug | INTRAMUSCULAR | Status: DC | PRN

## 2015-10-01 MED ORDER — ONDANSETRON HCL 4 MG/2ML IJ SOLN: 4.0000 mg | Freq: Once | INTRAMUSCULAR | Status: DC | PRN

## 2015-10-01 MED ORDER — CEFAZOLIN SODIUM-DEXTROSE 2-3 GM-% IV SOLR
2.0000 g | INTRAVENOUS | Status: AC
  Administered 2015-10-01: 2 g via INTRAVENOUS

## 2015-10-01 MED ORDER — FENTANYL CITRATE (PF) 100 MCG/2ML IJ SOLN
INTRAMUSCULAR | Status: DC | PRN
  Administered 2015-10-01: 50 ug via INTRAVENOUS

## 2015-10-01 MED ORDER — LIDOCAINE HCL (CARDIAC) 20 MG/ML IV SOLN
INTRAVENOUS | Status: DC | PRN
  Administered 2015-10-01: 60 mg via INTRAVENOUS

## 2015-10-01 MED ORDER — SODIUM CHLORIDE 0.9 % IV SOLN
INTRAVENOUS | Status: DC
  Administered 2015-10-01 (×2): via INTRAVENOUS

## 2015-10-01 MED ORDER — LIDOCAINE-EPINEPHRINE (PF) 1 %-1:200000 IJ SOLN
INTRAMUSCULAR | Status: AC
  Filled 2015-10-01: qty 30

## 2015-10-01 MED ORDER — HYDROCODONE-ACETAMINOPHEN 5-325 MG PO TABS
ORAL_TABLET | ORAL | Status: AC
  Filled 2015-10-01: qty 1

## 2015-10-01 MED ORDER — BACITRACIN ZINC 500 UNIT/GM EX OINT
TOPICAL_OINTMENT | CUTANEOUS | Status: AC
  Filled 2015-10-01: qty 28.35

## 2015-10-01 MED ORDER — LIDOCAINE-EPINEPHRINE (PF) 1 %-1:200000 IJ SOLN
INTRAMUSCULAR | Status: DC | PRN
  Administered 2015-10-01: 5 mL

## 2015-10-01 MED ORDER — PHENYLEPHRINE HCL 10 MG/ML IJ SOLN
INTRAMUSCULAR | Status: DC | PRN
  Administered 2015-10-01: 100 ug via INTRAVENOUS

## 2015-10-01 MED ORDER — CEFAZOLIN SODIUM-DEXTROSE 2-3 GM-% IV SOLR
INTRAVENOUS | Status: AC
  Filled 2015-10-01: qty 50

## 2015-10-01 MED ORDER — PROPOFOL 10 MG/ML IV BOLUS
INTRAVENOUS | Status: DC | PRN
  Administered 2015-10-01: 180 mg via INTRAVENOUS

## 2015-10-01 SURGICAL SUPPLY — 22 items
CANISTER SUCT 1200ML W/VALVE (MISCELLANEOUS) ×3 IMPLANT
CHLORAPREP W/TINT 26ML (MISCELLANEOUS) ×3 IMPLANT
DRAPE LAPAROTOMY 100X77 ABD (DRAPES) ×3 IMPLANT
ELECT CAUTERY BLADE 6.4 (BLADE) ×3 IMPLANT
GLOVE BIO SURGEON STRL SZ7 (GLOVE) ×15 IMPLANT
GOWN STRL REUS W/ TWL LRG LVL3 (GOWN DISPOSABLE) ×3 IMPLANT
GOWN STRL REUS W/TWL LRG LVL3 (GOWN DISPOSABLE) ×6
KIT RM TURNOVER STRD PROC AR (KITS) ×3 IMPLANT
LABEL OR SOLS (LABEL) ×3 IMPLANT
NEEDLE HYPO 25X1 1.5 SAFETY (NEEDLE) ×3 IMPLANT
PACK BASIN MINOR ARMC (MISCELLANEOUS) ×3 IMPLANT
PAD GROUND ADULT SPLIT (MISCELLANEOUS) ×3 IMPLANT
SUT ETHILON 2 0 FS 18 (SUTURE) ×3 IMPLANT
SUT MNCRL 4-0 (SUTURE)
SUT MNCRL 4-0 27XMFL (SUTURE)
SUT VIC AB 2-0 CT2 27 (SUTURE) ×3 IMPLANT
SUT VIC AB 3-0 SH 27 (SUTURE)
SUT VIC AB 3-0 SH 27X BRD (SUTURE) IMPLANT
SUTURE MNCRL 4-0 27XMF (SUTURE) IMPLANT
SYR BULB EAR ULCER 3OZ GRN STR (SYRINGE) ×3 IMPLANT
SYRINGE 10CC LL (SYRINGE) ×3 IMPLANT
WATER STERILE IRR 1000ML POUR (IV SOLUTION) ×3 IMPLANT

## 2015-10-01 NOTE — Anesthesia Preprocedure Evaluation (Signed)
Anesthesia Evaluation  Patient identified by MRN, date of birth, ID band Patient awake    Reviewed: Allergy & Precautions, NPO status , Patient's Chart, lab work & pertinent test results, reviewed documented beta blocker date and time   Airway Mallampati: III       Dental no notable dental hx.    Pulmonary sleep apnea and Continuous Positive Airway Pressure Ventilation , COPD, former smoker,     + decreased breath sounds      Cardiovascular Exercise Tolerance: Poor hypertension, Pt. on medications and Pt. on home beta blockers  Rhythm:Regular Rate:Normal     Neuro/Psych Depression    GI/Hepatic Neg liver ROS, GERD  ,  Endo/Other  diabetes, Well Controlled, Type 1, Insulin Dependent  Renal/GU negative Renal ROS     Musculoskeletal   Abdominal (+) + obese,   Peds  Hematology  (+) anemia ,   Anesthesia Other Findings   Reproductive/Obstetrics                             Anesthesia Physical Anesthesia Plan  ASA: III  Anesthesia Plan: General   Post-op Pain Management:    Induction: Intravenous  Airway Management Planned: LMA  Additional Equipment:   Intra-op Plan:   Post-operative Plan: Extubation in OR  Informed Consent: I have reviewed the patients History and Physical, chart, labs and discussed the procedure including the risks, benefits and alternatives for the proposed anesthesia with the patient or authorized representative who has indicated his/her understanding and acceptance.     Plan Discussed with: CRNA  Anesthesia Plan Comments:         Anesthesia Quick Evaluation

## 2015-10-01 NOTE — Anesthesia Postprocedure Evaluation (Signed)
Anesthesia Post Note  Patient: Robin Huff  Procedure(s) Performed: Procedure(s) (LRB): DEBRIDEMENT AND CLOSURE WOUND (Left)  Patient location during evaluation: PACU Anesthesia Type: General Level of consciousness: awake and oriented Pain management: pain level controlled Vital Signs Assessment: post-procedure vital signs reviewed and stable Respiratory status: nonlabored ventilation Cardiovascular status: stable Anesthetic complications: no    Last Vitals:  Filed Vitals:   10/01/15 0806 10/01/15 0950  BP: 165/93 113/59  Pulse: 64 79  Temp: 35.8 C 37.1 C  Resp: 20 17    Last Pain:  Filed Vitals:   10/01/15 0951  PainSc: 2                  VAN STAVEREN,Kylar Speelman

## 2015-10-01 NOTE — Op Note (Signed)
  10/01/2015  9:51 AM  PATIENT:  Robin Huff  70 y.o. female  PRE-OPERATIVE DIAGNOSIS:  Left sided stage one breast cancer   POST-OPERATIVE DIAGNOSIS:  Same  PROCEDURE:  Excision of open wound and closure  SURGEON:  Surgeon(s) and Role:    * Clayburn Pert, MD - Primary  ASSISTANTS: None  ANESTHESIA: General  INDICATIONS FOR PROCEDURE Non-healing wound from prior lumpectomy  DICTATION: Patient identified in the preop hold. H&P and consent confirmed and patient desires to proceed. All questions answered to patient's satisfaction.  Patient taken to the operating room and placed in the supine position where general anesthesia was obtained. After an uneventful induction and intubation the patient was prepped and draped in the standard fashion. A timeout was performed that confirmed the patient, procedure and safety precautions. We then proceeded with an excision of the open left breast wound.  The skin around the incision was excised with a 10 blade scalpel and then taken down and around the entire prior wound with electrocautery. The wound was localized with marcaine prior to incision. The wound was then irrigated and hemostasis was confirmed. The wound was then closed in layers. A deep layer of interrupted 2-O vicryl was placed and then the skin was closed with 2-O nylon suture with ease.   A dressing of bacitracin and plane gauze was placed over this. All counts were correct and there were no immediate complications. Patient was awoken from anesthesia and transferred to the PACU in good condition.  Findings: Chronic left breast wound from cancer excision  EBL: 31ml's   Clayburn Pert, MD

## 2015-10-01 NOTE — Brief Op Note (Signed)
10/01/2015  9:50 AM  PATIENT:  Robin Huff  70 y.o. female  PRE-OPERATIVE DIAGNOSIS:  BREAST CANCER STAGE 1  POST-OPERATIVE DIAGNOSIS:  BREAST CANCER STAGE 1  PROCEDURE:  Procedure(s): DEBRIDEMENT AND CLOSURE WOUND (Left)  SURGEON:  Surgeon(s) and Role:    * Clayburn Pert, MD - Primary  PHYSICIAN ASSISTANT:   ASSISTANTS: none   ANESTHESIA:   general  EBL:  Total I/O In: 600 [I.V.:600] Out: -   BLOOD ADMINISTERED:none  DRAINS: none   LOCAL MEDICATIONS USED:  XYLOCAINE   SPECIMEN:  Source of Specimen:  left breast margin  DISPOSITION OF SPECIMEN:  PATHOLOGY  COUNTS:  YES  TOURNIQUET:  * No tourniquets in log *  DICTATION: .Dragon Dictation  PLAN OF CARE: Discharge to home after PACU  PATIENT DISPOSITION:  PACU - hemodynamically stable.   Delay start of Pharmacological VTE agent (>24hrs) due to surgical blood loss or risk of bleeding: not applicable

## 2015-10-01 NOTE — Discharge Instructions (Signed)
Excision of Skin Lesions, Care After Refer to this sheet in the next few weeks. These instructions provide you with information about caring for yourself after your procedure. Your health care provider may also give you more specific instructions. Your treatment has been planned according to current medical practices, but problems sometimes occur. Call your health care provider if you have any problems or questions after your procedure. WHAT TO EXPECT AFTER THE PROCEDURE After your procedure, it is common to have pain or discomfort at the excision site. HOME CARE INSTRUCTIONS  Take over-the-counter and prescription medicines only as told by your health care provider.  Follow instructions from your health care provider about:  How to take care of your excision site. You should keep the site clean, dry, and protected for at least 48 hours.  When and how you should change your bandage (dressing).  When you should remove your dressing.  Removing whatever was used to close your excision site.  Check the excision area every day for signs of infection. Watch for:  Redness, swelling, or pain.  Fluid, blood, or pus.  For bleeding, apply gentle but firm pressure to the area using a folded towel for 20 minutes.  Avoid high-impact exercise and activities until the stitches (sutures) are removed or the area heals.  Follow instructions from your health care provider about how to minimize scarring. Avoid sun exposure until the area has healed. Scarring should lessen over time.  Keep all follow-up visits as told by your health care provider. This is important. SEEK MEDICAL CARE IF:  You have a fever.  You have redness, swelling, or pain at the excision site.  You have fluid, blood, or pus coming from the excision site.  You have ongoing bleeding at the excision site.  You have pain that does not improve in 2-3 days after your procedure.  You notice skin irregularities or changes in  sensation.   This information is not intended to replace advice given to you by your health care provider. Make sure you discuss any questions you have with your health care provider.   Document Released: 01/28/2015 Document Reviewed: 01/28/2015 Elsevier Interactive Patient Education 2016 [place] Anesthesia, Adult, Care After Refer to this sheet in the next few weeks. These instructions provide you with information on caring for yourself after your procedure. Your health care provider may also give you more specific instructions. Your treatment has been planned according to current medical practices, but problems sometimes occur. Call your health care provider if you have any problems or questions after your procedure. WHAT TO EXPECT AFTER THE PROCEDURE After the procedure, it is typical to experience:  Sleepiness.  Nausea and vomiting. HOME CARE INSTRUCTIONS  For the first 24 hours after general anesthesia:  Have a responsible person with you.  Do not drive a car. If you are alone, do not take public transportation.  Do not drink alcohol.  Do not take medicine that has not been prescribed by your health care provider.  Do not sign important papers or make important decisions.  You may resume a normal diet and activities as directed by your health care provider.  Change bandages (dressings) as directed.  If you have questions or problems that seem related to general anesthesia, call the hospital and ask for the anesthetist or anesthesiologist on call. SEEK MEDICAL CARE IF:  You have nausea and vomiting that continue the day after anesthesia.  You develop a rash. SEEK IMMEDIATE MEDICAL CARE IF:  You have difficulty breathing.  You have chest pain.  You have any allergic problems.   This information is not intended to replace advice given to you by your health care provider. Make sure you discuss any questions you have with your health care provider.     Document Released: 12/20/2000 Document Revised: 10/04/2014 Document Reviewed: 01/12/2012 Elsevier Interactive Patient Education 2016 [place] dressing tomorrow and may take a shower. Change dressing daily as needed.

## 2015-10-01 NOTE — H&P (View-Only) (Signed)
Outpatient Surgical Follow Up  09/25/2015  ICIS STRATIS is an 70 y.o. female.   Chief Complaint  Patient presents with  . Routine Post Op    Left Lumpectomy 07/18/2015 Dr. Azalee Course    HPI: 70 year old female well-known the surgery department returns follow-up of a left breast wound. Wound was created on October 21 for treatment of her stage I breast cancer. She knows that it continues to have some pink drainage on her daily dressing changes. It is not changed in size at all. She is very concerned due to this lack of progress as she is still in need of radiation therapy for her breast cancer. She denies any fevers, chills, nausea, vomiting, diarrhea, constipation, chest pain, short of breath. She states she's not an active smoker but smells heavily of cigarette smoke.  Past Medical History  Diagnosis Date  . Cholelithiasis 05/27/2015    Requiring Cholecystectomy 08/14/2014- Dr. Pat Patrick   . BP (high blood pressure) 03/31/2014  . B12 deficiency 04/01/2014  . Acid reflux 03/31/2014  . Diabetes mellitus, type 2 ([place]) 03/31/2014  . Insomnia, persistent 04/01/2014  . 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 Abnormal finding on mammography 11/21/2012    Overview:  Biopsied 11/25/2012 by Dr. Pat Patrick; was due for screening mammogram February 2015.   Marland Kitchen Chronic LBP 05/02/2015  . Clinical depression 03/31/2014    Overview:  S/P suicide attempt 02/1995   . HLD (hyperlipidemia) 03/31/2014  . Apnea, sleep 03/31/2014    Overview:  on CPAP   . Obesity 05/27/2015  . Cancer (HCC)     BREAST  . Dysrhythmia     h/o fluttering occ  . Anemia     h/o  . COPD (chronic obstructive pulmonary disease) ([place])     per cxr on 07-10-15    Past Surgical History  Procedure Laterality Date  . Abdominal hysterectomy      Total  . Laparoscopic cholecystectomy  08/14/2010    Dr. Pat Patrick  . Back surgery      Multiple  . Neck surgery      Multiple  . Knee surgery Left   . Breast biopsy Right 06/20/09    neg  . Breast biopsy Left 12/11/12    neg   . Breast biopsy Left 06/04/2015  . Ulnar nerve repair    . Carpal tunnel release Right   . Breast lumpectomy with sentinel lymph node biopsy Left 07/18/2015    Procedure: BREAST LUMPECTOMY WITH SENTINEL LYMPH NODE BX;  Surgeon: Hubbard Robinson, MD;  Location: [facility] ORS;  Service: General;  Laterality: Left;  . Sentinel node biopsy Left 07/18/2015    Procedure: SENTINEL NODE BIOPSY/NEEDLE LOC;  Surgeon: Hubbard Robinson, MD;  Location: [facility] ORS;  Service: General;  Laterality: Left;    Family History  Problem Relation Age of Onset  . Hypertension    . Stroke    . Diabetes    . Heart disease    . Hypertension Mother   . Diabetes Brother     Social History:  reports that she quit smoking about 2 weeks ago. Her smoking use included Cigarettes. She has a 40 pack-year smoking history. She has never used smokeless tobacco. She reports that she does not drink alcohol or use illicit drugs.  Allergies:  Allergies  Allergen Reactions  . Chlorthalidone Other (See Comments)  . Ciprofloxacin Swelling    Swelling of hands and feet  . Codeine Nausea Only  . Diclofenac     Other reaction(s): Abdominal  Pain  . Hydrochlorothiazide     Other reaction(s): Other (See Comments) Leg cramps  . Adhesive [Tape] Rash    IF LEFT ON FOR LONG PERIODS-PAPER TAPE OK TO USE    Medications reviewed.    ROS  A multipoint review of systems was completed. All pertinent positives and negatives were documented within the history of present illness remainder are negative.  BP 188/99 mmHg  Pulse 67  Temp(Src) 97.9 F (36.6 C)  Wt 106.595 kg (235 lb)  Physical Exam  Gen.: No acute distress Chest: Clear to auscultation Heart: Regular rate and rhythm Breast: Left breast examined with a open wound at the 11:00 position from the nipple areolar complex this wound measures 3 x 2 x 2 cm. It is unchanged from previous examinations. No evidence of erythema or purulent drainage. Abdomen: Soft, nontender,  nondistended   No results found for this or any previous visit (from the past 48 hour(s)). No results found.  Assessment/Plan:  1. Aftercare following surgery Wound remains uninfected but continues to be open without any evidence of healing.  2. Breast cancer, stage 1, left ([place]) Patient is status post lumpectomy which is the cause of her current wound. She is in need of radiation therapy to complete her cancer treatment however radiation cannot be started until she has close skin. Discussed the option of doing a reexcision and closure of the wound. She voiced a desire to do so so that she can get this healed and start radiation therapy. We will plan for treatment the operating room next week to debride the wound and close it again this time with a nylon suture and set of a subcutaneous suture. Patient voiced understanding wished proceed. Plan for surgery on 10/01/2015.     Clayburn Pert, MD FACS General Surgeon  09/25/2015,11:37 AM

## 2015-10-01 NOTE — Anesthesia Procedure Notes (Signed)
Procedure Name: LMA Insertion Date/Time: 10/01/2015 9:05 AM Performed by: Delaney Meigs Pre-anesthesia Checklist: Patient identified, Emergency Drugs available, Suction available, Patient being monitored and Timeout performed Patient Re-evaluated:Patient Re-evaluated prior to inductionOxygen Delivery Method: Circle system utilized and Simple face mask Preoxygenation: Pre-oxygenation with 100% oxygen Intubation Type: IV induction Ventilation: Mask ventilation without difficulty LMA Size: 3.5 Number of attempts: 1 Placement Confirmation: breath sounds checked- equal and bilateral and positive ETCO2 Tube secured with: Tape

## 2015-10-01 NOTE — Interval H&P Note (Signed)
History and Physical Interval Note:  10/01/2015 8:59 AM  Robin Huff  has presented today for surgery, with the diagnosis of BREAST CANCER STAGE 1  The various methods of treatment have been discussed with the patient and family. After consideration of risks, benefits and other options for treatment, the patient has consented to  Procedure(s): DEBRIDEMENT AND CLOSURE WOUND (Left) as a surgical intervention .  The patient's history has been reviewed, patient examined, no change in status, stable for surgery.  I have reviewed the patient's chart and labs.  Questions were answered to the patient's satisfaction.     Clayburn Pert

## 2015-10-01 NOTE — Transfer of Care (Signed)
Immediate Anesthesia Transfer of Care Note  Patient: Robin Huff  Procedure(s) Performed: Procedure(s): DEBRIDEMENT AND CLOSURE WOUND (Left)  Patient Location: PACU  Anesthesia Type:General  Level of Consciousness: awake, alert  and oriented  Airway & Oxygen Therapy: Patient Spontanous Breathing and Patient connected to face mask oxygen  Post-op Assessment: Report given to RN and Post -op Vital signs reviewed and stable  Post vital signs: Reviewed and stable  Last Vitals: 9:51 98% 72 hr 16resp 113/59 Filed Vitals:   10/01/15 0806  BP: 165/93  Pulse: 64  Temp: 35.8 C  Resp: 20    Complications: No apparent anesthesia complications

## 2015-10-02 LAB — SURGICAL PATHOLOGY

## 2015-10-16 ENCOUNTER — Ambulatory Visit: Payer: Medicare Other | Admitting: Radiation Oncology

## 2015-10-21 ENCOUNTER — Ambulatory Visit
Admission: RE | Admit: 2015-10-21 | Discharge: 2015-10-21 | Disposition: A | Discharge status: Home / Self Care | Payer: Medicare Other | Source: Ambulatory Visit | Attending: Unknown Physician Specialty | Admitting: Unknown Physician Specialty

## 2015-10-21 ENCOUNTER — Ambulatory Visit: Payer: Medicare Other

## 2015-10-21 DIAGNOSIS — M4804 Spinal stenosis, thoracic region: Secondary | ICD-10-CM | POA: Insufficient documentation

## 2015-10-21 DIAGNOSIS — M160 Bilateral primary osteoarthritis of hip: Secondary | ICD-10-CM | POA: Diagnosis not present

## 2015-10-21 DIAGNOSIS — M79605 Pain in left leg: Secondary | ICD-10-CM | POA: Insufficient documentation

## 2015-10-21 DIAGNOSIS — M25552 Pain in left hip: Secondary | ICD-10-CM | POA: Insufficient documentation

## 2015-10-21 DIAGNOSIS — M4854XA Collapsed vertebra, not elsewhere classified, thoracic region, initial encounter for fracture: Secondary | ICD-10-CM | POA: Insufficient documentation

## 2015-10-21 DIAGNOSIS — S76012A Strain of muscle, fascia and tendon of left hip, initial encounter: Secondary | ICD-10-CM | POA: Diagnosis not present

## 2015-10-21 DIAGNOSIS — R531 Weakness: Secondary | ICD-10-CM | POA: Insufficient documentation

## 2015-10-21 DIAGNOSIS — R29898 Other symptoms and signs involving the musculoskeletal system: Secondary | ICD-10-CM

## 2015-10-21 DIAGNOSIS — G8929 Other chronic pain: Secondary | ICD-10-CM | POA: Insufficient documentation

## 2015-10-21 DIAGNOSIS — M4856XA Collapsed vertebra, not elsewhere classified, lumbar region, initial encounter for fracture: Secondary | ICD-10-CM | POA: Insufficient documentation

## 2015-10-21 MED ORDER — GADOBENATE DIMEGLUMINE 529 MG/ML IV SOLN
20.0000 mL | Freq: Once | INTRAVENOUS | Status: AC | PRN
  Administered 2015-10-21: 20 mL via INTRAVENOUS

## 2015-10-23 ENCOUNTER — Encounter: Payer: Self-pay | Admitting: General Surgery

## 2015-10-23 ENCOUNTER — Ambulatory Visit (INDEPENDENT_AMBULATORY_CARE_PROVIDER_SITE_OTHER): Payer: Medicare Other | Admitting: General Surgery

## 2015-10-23 VITALS — BP 178/69 | HR 63 | Temp 98.1°F | Ht 67.0 in | Wt 240.0 lb

## 2015-10-23 DIAGNOSIS — Z4889 Encounter for other specified surgical aftercare: Secondary | ICD-10-CM

## 2015-10-23 NOTE — Patient Instructions (Signed)
We will see you back for a follow-up with either Dr. Azalee Course or Dr. Adonis Huguenin in 3 months. I will call you a few weeks ahead to get you scheduled.   Keep your appointment wit Dr. Donella Stade for radiation.  Call with any questions or concerns and speak with a nurse.  GOOD WORK on your stopping smoking! Great Job :)

## 2015-10-23 NOTE — Progress Notes (Signed)
Outpatient Surgical Follow Up  10/23/2015  Robin Huff is an 70 y.o. female.   Chief Complaint  Patient presents with  . Wound Check    Left Breast    HPI:  70 year old female returns to clinic for follow-up 3 weeks status post reexcision of left breast lesion. She reports no complaints. She denies any fevers, chills, nausea, vomiting, chest pain, shortness of breath, diarrhea, constipation. She states the area is healed nicely. She's been very happy with her surgical care.  Past Medical History  Diagnosis Date  . Cholelithiasis 05/27/2015    Requiring Cholecystectomy 08/14/2014- Dr. Pat Patrick   . BP (high blood pressure) 03/31/2014  . B12 deficiency 04/01/2014  . Acid reflux 03/31/2014  . Diabetes mellitus, type 2 ([place]) 03/31/2014  . Insomnia, persistent 04/01/2014  . 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 Abnormal finding on mammography 11/21/2012    Overview:  Biopsied 11/25/2012 by Dr. Pat Patrick; was due for screening mammogram February 2015.   Marland Kitchen Chronic LBP 05/02/2015  . Clinical depression 03/31/2014    Overview:  S/P suicide attempt 02/1995   . HLD (hyperlipidemia) 03/31/2014  . Apnea, sleep 03/31/2014    Overview:  on CPAP   . Obesity 05/27/2015  . Cancer (HCC)     BREAST  . Dysrhythmia     h/o fluttering occ  . Anemia     h/o  . COPD (chronic obstructive pulmonary disease) ([place])     per cxr on 07-10-15    Past Surgical History  Procedure Laterality Date  . Abdominal hysterectomy      Total  . Laparoscopic cholecystectomy  08/14/2010    Dr. Pat Patrick  . Back surgery      Multiple  . Neck surgery      Multiple  . Knee surgery Left   . Breast biopsy Right 06/20/09    neg  . Breast biopsy Left 12/11/12    neg  . Breast biopsy Left 06/04/2015  . Ulnar nerve repair    . Carpal tunnel release Right   . Breast lumpectomy with sentinel lymph node biopsy Left 07/18/2015    Procedure: BREAST LUMPECTOMY WITH SENTINEL LYMPH NODE BX;  Surgeon: Hubbard Robinson, MD;  Location: [facility] ORS;  Service: General;   Laterality: Left;  . Sentinel node biopsy Left 07/18/2015    Procedure: SENTINEL NODE BIOPSY/NEEDLE LOC;  Surgeon: Hubbard Robinson, MD;  Location: [facility] ORS;  Service: General;  Laterality: Left;  . Debridement and closure wound Left 10/01/2015    Procedure: DEBRIDEMENT AND CLOSURE WOUND;  Surgeon: Clayburn Pert, MD;  Location: [facility] ORS;  Service: General;  Laterality: Left;    Family History  Problem Relation Age of Onset  . Hypertension    . Stroke    . Diabetes    . Heart disease    . Hypertension Mother   . Diabetes Brother     Social History:  reports that she quit smoking about 6 weeks ago. Her smoking use included Cigarettes. She has a 40 pack-year smoking history. She has never used smokeless tobacco. She reports that she does not drink alcohol or use illicit drugs.  Allergies:  Allergies  Allergen Reactions  . Chlorthalidone Other (See Comments)  . Ciprofloxacin Swelling    Swelling of hands and feet  . Codeine Nausea Only  . Diclofenac     Other reaction(s): Abdominal Pain  . Hydrochlorothiazide     Other reaction(s): Other (See Comments) Leg cramps  . Adhesive [Tape] Rash  IF LEFT ON FOR LONG PERIODS-PAPER TAPE OK TO USE  . Betadine [Povidone Iodine] Rash    Medications reviewed.    ROS  a multipoint review of systems was completed, all pertinent positives and negatives within the history of present illness remainder negative.   BP 178/69 mmHg  Pulse 63  Temp(Src) 98.1 F (36.7 C) (Oral)  Ht 5\' 7"  (1.702 m)  Wt 108.863 kg (240 lb)  BMI 37.58 kg/m2  Physical Exam  Gen.: No acute distress  chest: Clear to auscultation Heart: Regular rate and rhythm Breast: Well approximated left breast excision site with sutures in place. No evidence of erythema, drainage, skin opening. Abdomen: Soft, nontender, nondistended.    No results found for this or any previous visit (from the past 48 hour(s)). Mr Lumbar Spine W Wo Contrast  10/21/2015  CLINICAL  DATA:  Low back pain and left hip pain over the last 3-4 months. EXAM: MRI LUMBAR SPINE WITHOUT AND WITH CONTRAST TECHNIQUE: Multiplanar and multiecho pulse sequences of the lumbar spine were obtained without and with intravenous contrast. CONTRAST:  20 mile MultiHance COMPARISON:  None. FINDINGS: Normal signal is present in the conus medullaris which terminates at T12-L1. Posterior fusion is noted L3-4. Remote compression fractures involve the superior endplate of 624THL, the inferior endplate of L1, and the inferior endplate of L5. There is retropulsion of bone at the superior endplate of 624THL extending posteriorly 7 mm within the central canal. No other significant bone retropulsion is evident. Grade 1 retrolisthesis is present at L1-2 and L2-3. Rightward curvature of the lumbar spine is centered at L2-3. Atherosclerotic changes are present in the aorta. Limited imaging of the abdomen is otherwise unremarkable. There is no significant adenopathy. A cystic area in the posterior right iliac bone likely reflects previous biopsy. T11-12: Retropulsed bone and facet hypertrophy contribute to mild to moderate central canal stenosis and mild foraminal narrowing bilaterally. T12-L1: Mild facet hypertrophy is present bilaterally without significant stenosis. L1-2: A leftward disc protrusion is present. Facet hypertrophy is worse on the left. This leads to moderate left and mild right subarticular narrowing. Moderate left and mild right foraminal stenosis is also present. L2-3: A broad-based disc protrusion is present. Moderate facet hypertrophy is worse on the left. Moderate subarticular and foraminal narrowing bilaterally is worse on the left. L3-4: Posterior fusion is solid. Bilateral laminectomies are present. No significant residual or recurrent stenosis is present. L4-5: A wide laminectomy is present. The posterior elements are fused. No significant residual or recurrent stenosis is present. L5-S1: Moderate facet spurring  is present. There is no significant stenosis. The postcontrast images demonstrate no pathologic enhancement. IMPRESSION: 1. Posterior fusion at L3-4, L4-5, and L5-S1. Hardware is intact posteriorly at L3-4. 2. Moderate subarticular and foraminal narrowing bilaterally at L2-3 is worse on the left. 3. Moderate left and mild right subarticular and foraminal narrowing at L1-2. 4. Remote superior endplate compression fracture at T12 with retropulsed bone contributing to mild to moderate central canal stenosis and mild foraminal narrowing bilaterally. 5. Remote compression fractures at L1 and L5. Electronically Signed   By: San Morelle M.D.   On: 10/21/2015 11:50   Mr Hip Left Wo Contrast  10/21/2015  CLINICAL DATA:  Prior lumbar surgery. Most recently 7 years ago. Low back pain with left hip pain for 3-4 months. EXAM: MR OF THE LEFT HIP WITHOUT CONTRAST TECHNIQUE: Multiplanar, multisequence MR imaging was performed. No intravenous contrast was administered. COMPARISON:  None. FINDINGS: Bones: No marrow signal abnormality. Small  femoral head marginal osteophytes. No acute fracture or dislocation. No sacral fracture. Postsurgical changes in the lower lumbar spine from posterior spinal fusion. Articular cartilage and labrum Articular cartilage: Mild partial-thickness cartilage loss of bilateral hips. Labrum: Grossly intact, but evaluation is limited by lack of intraarticular fluid. Joint or bursal effusion Joint effusion:  No joint effusion. Bursae:  No bursa formation. Muscles and tendons Flexors: Normal. Extensors: Normal. Abductors: Normal. Adductors: Normal. Rotators: Normal. Hamstrings: Moderate tendinosis of the left hamstring origin with a partial-thickness tear. Other findings Miscellaneous: No focal fluid collection. No hematoma. No pelvic free fluid. Sigmoid diverticulosis without evidence of diverticulitis. No inguinal lymphadenopathy. IMPRESSION: 1. Mild osteoarthritis bilateral hips. 2. Moderate  tendinosis of the left hamstring origin with a partial-thickness tear. Electronically Signed   By: Kathreen Devoid   On: 10/21/2015 11:43    Assessment/Plan:  1. Aftercare following surgery  70 year old female 3 weeks status post reexcision and closure of left breast lesion. The area has healed nicely. Sutures removed in clinic without any, location. She'll follow up and 3 months for her regular breast cancer follow-up. She has an appointment with radiation oncology to start her radiation therapy next week.     Clayburn Pert, MD FACS General Surgeon  10/23/2015,10:56 AM

## 2015-10-24 LAB — POCT I-STAT CREATININE: Creatinine, Ser: 0.6 mg/dL (ref 0.44–1.00)

## 2015-10-30 ENCOUNTER — Ambulatory Visit
Admission: RE | Admit: 2015-10-30 | Discharge: 2015-10-30 | Disposition: A | Discharge status: Home / Self Care | Payer: Medicare Other | Source: Ambulatory Visit | Attending: Radiation Oncology | Admitting: Radiation Oncology

## 2015-10-30 ENCOUNTER — Encounter: Payer: Self-pay | Admitting: Radiation Oncology

## 2015-10-30 VITALS — BP 150/75 | HR 69 | Temp 96.9°F | Resp 18 | Wt 234.1 lb

## 2015-10-30 DIAGNOSIS — Z170 Estrogen receptor positive status [ER+]: Secondary | ICD-10-CM | POA: Insufficient documentation

## 2015-10-30 DIAGNOSIS — C50912 Malignant neoplasm of unspecified site of left female breast: Secondary | ICD-10-CM | POA: Insufficient documentation

## 2015-10-30 DIAGNOSIS — Z510 Encounter for antineoplastic radiation therapy: Secondary | ICD-10-CM | POA: Insufficient documentation

## 2015-10-30 NOTE — Progress Notes (Signed)
Radiation Oncology Follow up Note  Name: Robin Huff   Date:   10/30/2015 MRN:  [id number] DOB: 11/09/1945    This 70 y.o. 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 Patient denied hold secondary to delayed healing of her lumpectomy site.  REFERRING PROVIDER: Ezequiel Kayser, MD  HPI: Patient is a 70 year old female originally consult back in 08/25/2015 for stage Ia invasive mammary carcinoma ER/PR positive of the left breast status post wide local excision and sentinel node biopsy. She had a low risk of recurrence by Oncotype DX.Marland Kitchen Her initial treatment have been delayed secondary to reexcision of the area secured poor wound healing. She is seen today and is doing well. She states the areas no longer tender and is healed nicely.  COMPLICATIONS OF TREATMENT: none  FOLLOW UP COMPLIANCE: keeps appointments   PHYSICAL EXAM:  BP 150/75 mmHg  Pulse 69  Temp(Src) 96.9 F (36.1 C)  Resp 18  Wt 234 lb 2.1 oz (106.2 kg) Left breast has a wide local excision site which is healed well no other dominant mass or nodularity is noted in either breast in 2 positions examined. No axillary or supraclavicular adenopathy is noted. Well-developed well-nourished patient in NAD. HEENT reveals PERLA, EOMI, discs not visualized.  Oral cavity is clear. No oral mucosal lesions are identified. Neck is clear without evidence of cervical or supraclavicular adenopathy. Lungs are clear to A&P. Cardiac examination is essentially unremarkable with regular rate and rhythm without murmur rub or thrill. Abdomen is benign with no organomegaly or masses noted. Motor sensory and DTR levels are equal and symmetric in the upper and lower extremities. Cranial nerves II through XII are grossly intact. Proprioception is intact. No peripheral adenopathy or edema is identified. No motor or sensory levels are  noted. Crude visual fields are within normal range.  RADIOLOGY RESULTS: No films for review  PLAN: At this time like to proceed with radiation therapy to her left breast. Would plan on delivering 5000 cGy over 5 weeks boosting her nor scar another 1400 cGy using electron beam again risks and benefits of treatment is initially were presented in her initial consultation were again reviewed. I have set up and ordered CT simulation for early next week. Side effects such as skin reaction fatigue alteration of blood counts hyperpigmentation of the skin all were discussed in detail with the patient.  I would like to take this opportunity for allowing me to participate in the care of your patient.Armstead Peaks., MD

## 2015-11-03 ENCOUNTER — Ambulatory Visit
Admission: RE | Admit: 2015-11-03 | Discharge: 2015-11-03 | Disposition: A | Discharge status: Home / Self Care | Payer: Medicare Other | Source: Ambulatory Visit | Attending: Radiation Oncology | Admitting: Radiation Oncology

## 2015-11-03 DIAGNOSIS — C50912 Malignant neoplasm of unspecified site of left female breast: Secondary | ICD-10-CM | POA: Diagnosis not present

## 2015-11-03 DIAGNOSIS — Z510 Encounter for antineoplastic radiation therapy: Secondary | ICD-10-CM | POA: Diagnosis not present

## 2015-11-03 DIAGNOSIS — Z170 Estrogen receptor positive status [ER+]: Secondary | ICD-10-CM | POA: Diagnosis not present

## 2015-11-05 DIAGNOSIS — C50912 Malignant neoplasm of unspecified site of left female breast: Secondary | ICD-10-CM | POA: Diagnosis not present

## 2015-11-07 ENCOUNTER — Other Ambulatory Visit: Payer: Self-pay | Admitting: *Deleted

## 2015-11-07 DIAGNOSIS — C50212 Malignant neoplasm of upper-inner quadrant of left female breast: Secondary | ICD-10-CM

## 2015-11-11 ENCOUNTER — Ambulatory Visit
Admission: RE | Admit: 2015-11-11 | Discharge: 2015-11-11 | Disposition: A | Discharge status: Home / Self Care | Payer: Medicare Other | Source: Ambulatory Visit | Attending: Radiation Oncology | Admitting: Radiation Oncology

## 2015-11-11 DIAGNOSIS — C50912 Malignant neoplasm of unspecified site of left female breast: Secondary | ICD-10-CM | POA: Diagnosis not present

## 2015-11-12 ENCOUNTER — Ambulatory Visit
Admission: RE | Admit: 2015-11-12 | Discharge: 2015-11-12 | Disposition: A | Discharge status: Home / Self Care | Payer: Medicare Other | Source: Ambulatory Visit | Attending: Radiation Oncology | Admitting: Radiation Oncology

## 2015-11-12 DIAGNOSIS — C50912 Malignant neoplasm of unspecified site of left female breast: Secondary | ICD-10-CM | POA: Diagnosis not present

## 2015-11-12 IMAGING — US US LT BREAST BX W LOC DEV 1ST LESION IMG BX SPEC US GUIDE
1 series · 1 of 1 positions shown · non-contrast
Comparison: Previous exam(s).

CLINICAL DATA: Suspicious left breast mass.

EXAM:
ULTRASOUND GUIDED LEFT BREAST CORE NEEDLE BIOPSY

[Series 1: us left breast bx w loc dev 1st lesion img bx spec · 0.08mm/px · 1 of 1 slices shown]
[im 1/1]
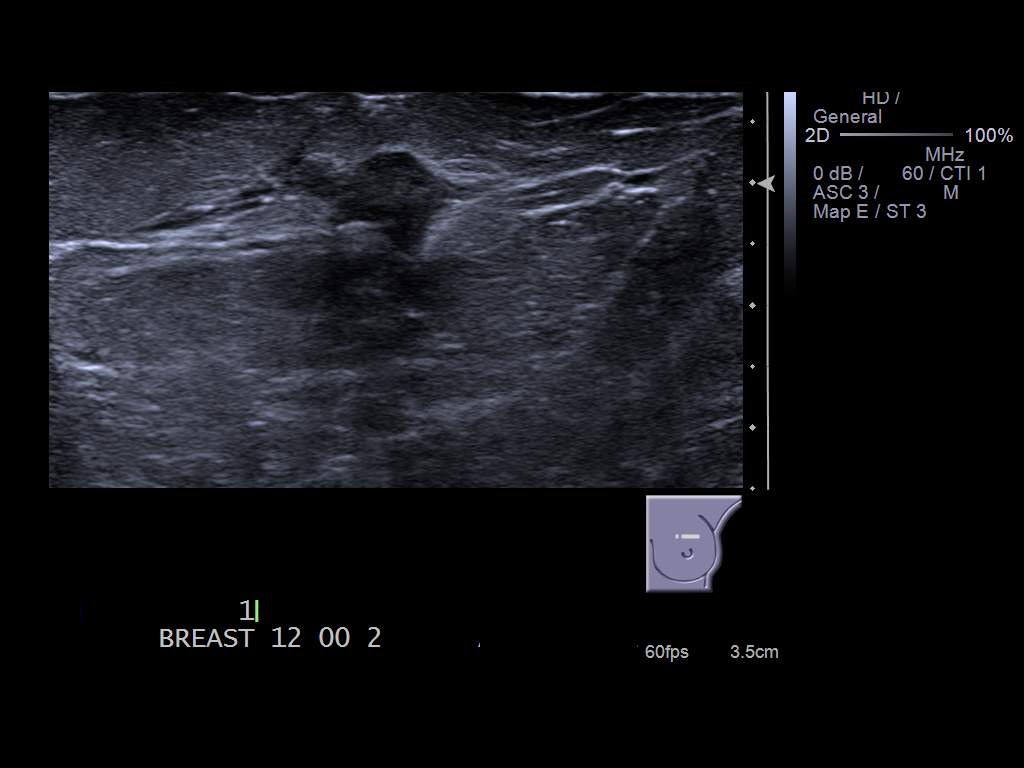

[1 of 1 positions shown; findings below may reference images not displayed]

PROCEDURE:
I met with the patient and we discussed the procedure of
ultrasound-guided biopsy, including benefits and alternatives. We
discussed the high likelihood of a successful procedure. We
discussed the risks of the procedure including infection, bleeding,
tissue injury, clip migration, and inadequate sampling. Informed
written consent was given. The usual time-out protocol was performed
immediately prior to the procedure.

Using sterile technique and 2% Lidocaine as local anesthetic, under
direct ultrasound visualization, a 12 gauge vacuum-assisted device
was used to perform biopsy of a mass in the 12 o'clock region of the
left breastusing a lateral to medial approach. At the conclusion of
the procedure, a coil shaped tissue marker clip was deployed into
the biopsy cavity. Follow-up 2-view mammogram was performed and
dictated separately.
IMPRESSION: Ultrasound-guided biopsy of the left breast. No apparent
complications.

## 2015-11-13 ENCOUNTER — Ambulatory Visit
Admission: RE | Admit: 2015-11-13 | Discharge: 2015-11-13 | Disposition: A | Discharge status: Home / Self Care | Payer: Medicare Other | Source: Ambulatory Visit | Attending: Radiation Oncology | Admitting: Radiation Oncology

## 2015-11-13 DIAGNOSIS — C50912 Malignant neoplasm of unspecified site of left female breast: Secondary | ICD-10-CM | POA: Diagnosis not present

## 2015-11-14 ENCOUNTER — Ambulatory Visit
Admission: RE | Admit: 2015-11-14 | Discharge: 2015-11-14 | Disposition: A | Discharge status: Home / Self Care | Payer: Medicare Other | Source: Ambulatory Visit | Attending: Radiation Oncology | Admitting: Radiation Oncology

## 2015-11-14 DIAGNOSIS — C50912 Malignant neoplasm of unspecified site of left female breast: Secondary | ICD-10-CM | POA: Diagnosis not present

## 2015-11-17 ENCOUNTER — Ambulatory Visit
Admission: RE | Admit: 2015-11-17 | Discharge: 2015-11-17 | Disposition: A | Discharge status: Home / Self Care | Payer: Medicare Other | Source: Ambulatory Visit | Attending: Radiation Oncology | Admitting: Radiation Oncology

## 2015-11-17 DIAGNOSIS — C50912 Malignant neoplasm of unspecified site of left female breast: Secondary | ICD-10-CM | POA: Diagnosis not present

## 2015-11-18 ENCOUNTER — Ambulatory Visit
Admission: RE | Admit: 2015-11-18 | Discharge: 2015-11-18 | Disposition: A | Discharge status: Home / Self Care | Payer: Medicare Other | Source: Ambulatory Visit | Attending: Radiation Oncology | Admitting: Radiation Oncology

## 2015-11-18 DIAGNOSIS — C50912 Malignant neoplasm of unspecified site of left female breast: Secondary | ICD-10-CM | POA: Diagnosis not present

## 2015-11-19 ENCOUNTER — Ambulatory Visit
Admission: RE | Admit: 2015-11-19 | Discharge: 2015-11-19 | Disposition: A | Discharge status: Home / Self Care | Payer: Medicare Other | Source: Ambulatory Visit | Attending: Radiation Oncology | Admitting: Radiation Oncology

## 2015-11-19 DIAGNOSIS — C50912 Malignant neoplasm of unspecified site of left female breast: Secondary | ICD-10-CM | POA: Diagnosis not present

## 2015-11-20 ENCOUNTER — Ambulatory Visit
Admission: RE | Admit: 2015-11-20 | Discharge: 2015-11-20 | Disposition: A | Discharge status: Home / Self Care | Payer: Medicare Other | Source: Ambulatory Visit | Attending: Radiation Oncology | Admitting: Radiation Oncology

## 2015-11-20 DIAGNOSIS — C50912 Malignant neoplasm of unspecified site of left female breast: Secondary | ICD-10-CM | POA: Diagnosis not present

## 2015-11-21 ENCOUNTER — Ambulatory Visit: Payer: Medicare Other

## 2015-11-24 ENCOUNTER — Ambulatory Visit
Admission: RE | Admit: 2015-11-24 | Discharge: 2015-11-24 | Disposition: A | Discharge status: Home / Self Care | Payer: Medicare Other | Source: Ambulatory Visit | Attending: Radiation Oncology | Admitting: Radiation Oncology

## 2015-11-24 ENCOUNTER — Inpatient Hospital Stay: Payer: Medicare Other | Attending: Radiation Oncology

## 2015-11-24 DIAGNOSIS — C50912 Malignant neoplasm of unspecified site of left female breast: Secondary | ICD-10-CM | POA: Insufficient documentation

## 2015-11-24 DIAGNOSIS — C50212 Malignant neoplasm of upper-inner quadrant of left female breast: Secondary | ICD-10-CM

## 2015-11-24 DIAGNOSIS — Z170 Estrogen receptor positive status [ER+]: Secondary | ICD-10-CM | POA: Insufficient documentation

## 2015-11-24 LAB — CBC
HCT: 45 % (ref 35.0–47.0)
Hemoglobin: 15 g/dL (ref 12.0–16.0)
MCH: 32 pg (ref 26.0–34.0)
MCHC: 33.3 g/dL (ref 32.0–36.0)
MCV: 96 fL (ref 80.0–100.0)
PLATELETS: 233 10*3/uL (ref 150–440)
RBC: 4.69 MIL/uL (ref 3.80–5.20)
RDW: 14.1 % (ref 11.5–14.5)
WBC: 13.6 10*3/uL — ABNORMAL HIGH (ref 3.6–11.0)

## 2015-11-25 ENCOUNTER — Ambulatory Visit
Admission: RE | Admit: 2015-11-25 | Discharge: 2015-11-25 | Disposition: A | Discharge status: Home / Self Care | Payer: Medicare Other | Source: Ambulatory Visit | Attending: Radiation Oncology | Admitting: Radiation Oncology

## 2015-11-25 DIAGNOSIS — C50912 Malignant neoplasm of unspecified site of left female breast: Secondary | ICD-10-CM | POA: Diagnosis not present

## 2015-11-26 ENCOUNTER — Ambulatory Visit
Admission: RE | Admit: 2015-11-26 | Discharge: 2015-11-26 | Disposition: A | Discharge status: Home / Self Care | Payer: Medicare Other | Source: Ambulatory Visit | Attending: Radiation Oncology | Admitting: Radiation Oncology

## 2015-11-26 DIAGNOSIS — C50912 Malignant neoplasm of unspecified site of left female breast: Secondary | ICD-10-CM | POA: Diagnosis not present

## 2015-11-27 ENCOUNTER — Ambulatory Visit
Admission: RE | Admit: 2015-11-27 | Discharge: 2015-11-27 | Disposition: A | Discharge status: Home / Self Care | Payer: Medicare Other | Source: Ambulatory Visit | Attending: Radiation Oncology | Admitting: Radiation Oncology

## 2015-11-27 DIAGNOSIS — C50912 Malignant neoplasm of unspecified site of left female breast: Secondary | ICD-10-CM | POA: Diagnosis not present

## 2015-11-28 ENCOUNTER — Ambulatory Visit
Admission: RE | Admit: 2015-11-28 | Discharge: 2015-11-28 | Disposition: A | Discharge status: Home / Self Care | Payer: Medicare Other | Source: Ambulatory Visit | Attending: Radiation Oncology | Admitting: Radiation Oncology

## 2015-11-28 DIAGNOSIS — C50912 Malignant neoplasm of unspecified site of left female breast: Secondary | ICD-10-CM | POA: Diagnosis not present

## 2015-12-01 ENCOUNTER — Ambulatory Visit
Admission: RE | Admit: 2015-12-01 | Discharge: 2015-12-01 | Disposition: A | Discharge status: Home / Self Care | Payer: Medicare Other | Source: Ambulatory Visit | Attending: Radiation Oncology | Admitting: Radiation Oncology

## 2015-12-01 DIAGNOSIS — C50912 Malignant neoplasm of unspecified site of left female breast: Secondary | ICD-10-CM | POA: Diagnosis not present

## 2015-12-02 ENCOUNTER — Ambulatory Visit
Admission: RE | Admit: 2015-12-02 | Discharge: 2015-12-02 | Disposition: A | Discharge status: Home / Self Care | Payer: Medicare Other | Source: Ambulatory Visit | Attending: Radiation Oncology | Admitting: Radiation Oncology

## 2015-12-02 DIAGNOSIS — C50912 Malignant neoplasm of unspecified site of left female breast: Secondary | ICD-10-CM | POA: Diagnosis not present

## 2015-12-03 ENCOUNTER — Ambulatory Visit
Admission: RE | Admit: 2015-12-03 | Discharge: 2015-12-03 | Disposition: A | Discharge status: Home / Self Care | Payer: Medicare Other | Source: Ambulatory Visit | Attending: Radiation Oncology | Admitting: Radiation Oncology

## 2015-12-03 DIAGNOSIS — C50912 Malignant neoplasm of unspecified site of left female breast: Secondary | ICD-10-CM | POA: Diagnosis not present

## 2015-12-04 ENCOUNTER — Ambulatory Visit
Admission: RE | Admit: 2015-12-04 | Discharge: 2015-12-04 | Disposition: A | Discharge status: Home / Self Care | Payer: Medicare Other | Source: Ambulatory Visit | Attending: Radiation Oncology | Admitting: Radiation Oncology

## 2015-12-04 DIAGNOSIS — C50912 Malignant neoplasm of unspecified site of left female breast: Secondary | ICD-10-CM | POA: Diagnosis not present

## 2015-12-05 ENCOUNTER — Ambulatory Visit
Admission: RE | Admit: 2015-12-05 | Discharge: 2015-12-05 | Disposition: A | Discharge status: Home / Self Care | Payer: Medicare Other | Source: Ambulatory Visit | Attending: Radiation Oncology | Admitting: Radiation Oncology

## 2015-12-05 DIAGNOSIS — C50912 Malignant neoplasm of unspecified site of left female breast: Secondary | ICD-10-CM | POA: Diagnosis not present

## 2015-12-08 ENCOUNTER — Ambulatory Visit
Admission: RE | Admit: 2015-12-08 | Discharge: 2015-12-08 | Disposition: A | Discharge status: Home / Self Care | Payer: Medicare Other | Source: Ambulatory Visit | Attending: Radiation Oncology | Admitting: Radiation Oncology

## 2015-12-08 ENCOUNTER — Inpatient Hospital Stay: Payer: Medicare Other | Attending: Radiation Oncology

## 2015-12-08 DIAGNOSIS — Z170 Estrogen receptor positive status [ER+]: Secondary | ICD-10-CM | POA: Insufficient documentation

## 2015-12-08 DIAGNOSIS — C50212 Malignant neoplasm of upper-inner quadrant of left female breast: Secondary | ICD-10-CM

## 2015-12-08 DIAGNOSIS — C50912 Malignant neoplasm of unspecified site of left female breast: Secondary | ICD-10-CM | POA: Diagnosis present

## 2015-12-08 LAB — CBC
HCT: 44.2 % (ref 35.0–47.0)
HEMOGLOBIN: 15 g/dL (ref 12.0–16.0)
MCH: 32.5 pg (ref 26.0–34.0)
MCHC: 33.9 g/dL (ref 32.0–36.0)
MCV: 95.7 fL (ref 80.0–100.0)
Platelets: 198 10*3/uL (ref 150–440)
RBC: 4.61 MIL/uL (ref 3.80–5.20)
RDW: 14.2 % (ref 11.5–14.5)
WBC: 11.7 10*3/uL — AB (ref 3.6–11.0)

## 2015-12-09 ENCOUNTER — Ambulatory Visit
Admission: RE | Admit: 2015-12-09 | Discharge: 2015-12-09 | Disposition: A | Discharge status: Home / Self Care | Payer: Medicare Other | Source: Ambulatory Visit | Attending: Radiation Oncology | Admitting: Radiation Oncology

## 2015-12-09 DIAGNOSIS — C50912 Malignant neoplasm of unspecified site of left female breast: Secondary | ICD-10-CM | POA: Diagnosis not present

## 2015-12-10 ENCOUNTER — Ambulatory Visit
Admission: RE | Admit: 2015-12-10 | Discharge: 2015-12-10 | Disposition: A | Discharge status: Home / Self Care | Payer: Medicare Other | Source: Ambulatory Visit | Attending: Radiation Oncology | Admitting: Radiation Oncology

## 2015-12-10 DIAGNOSIS — C50912 Malignant neoplasm of unspecified site of left female breast: Secondary | ICD-10-CM | POA: Diagnosis not present

## 2015-12-11 ENCOUNTER — Ambulatory Visit
Admission: RE | Admit: 2015-12-11 | Discharge: 2015-12-11 | Disposition: A | Discharge status: Home / Self Care | Payer: Medicare Other | Source: Ambulatory Visit | Attending: Radiation Oncology | Admitting: Radiation Oncology

## 2015-12-11 DIAGNOSIS — C50912 Malignant neoplasm of unspecified site of left female breast: Secondary | ICD-10-CM | POA: Diagnosis not present

## 2015-12-12 ENCOUNTER — Ambulatory Visit
Admission: RE | Admit: 2015-12-12 | Discharge: 2015-12-12 | Disposition: A | Discharge status: Home / Self Care | Payer: Medicare Other | Source: Ambulatory Visit | Attending: Radiation Oncology | Admitting: Radiation Oncology

## 2015-12-12 DIAGNOSIS — C50912 Malignant neoplasm of unspecified site of left female breast: Secondary | ICD-10-CM | POA: Diagnosis not present

## 2015-12-15 ENCOUNTER — Ambulatory Visit
Admission: RE | Admit: 2015-12-15 | Discharge: 2015-12-15 | Disposition: A | Discharge status: Home / Self Care | Payer: Medicare Other | Source: Ambulatory Visit | Attending: Radiation Oncology | Admitting: Radiation Oncology

## 2015-12-15 DIAGNOSIS — C50912 Malignant neoplasm of unspecified site of left female breast: Secondary | ICD-10-CM | POA: Diagnosis not present

## 2015-12-16 ENCOUNTER — Ambulatory Visit
Admission: RE | Admit: 2015-12-16 | Discharge: 2015-12-16 | Disposition: A | Discharge status: Home / Self Care | Payer: Medicare Other | Source: Ambulatory Visit | Attending: Radiation Oncology | Admitting: Radiation Oncology

## 2015-12-16 DIAGNOSIS — C50912 Malignant neoplasm of unspecified site of left female breast: Secondary | ICD-10-CM | POA: Diagnosis not present

## 2015-12-17 ENCOUNTER — Ambulatory Visit
Admission: RE | Admit: 2015-12-17 | Discharge: 2015-12-17 | Disposition: A | Discharge status: Home / Self Care | Payer: Medicare Other | Source: Ambulatory Visit | Attending: Radiation Oncology | Admitting: Radiation Oncology

## 2015-12-17 DIAGNOSIS — C50912 Malignant neoplasm of unspecified site of left female breast: Secondary | ICD-10-CM | POA: Diagnosis not present

## 2015-12-18 ENCOUNTER — Ambulatory Visit
Admission: RE | Admit: 2015-12-18 | Discharge: 2015-12-18 | Disposition: A | Discharge status: Home / Self Care | Payer: Medicare Other | Source: Ambulatory Visit | Attending: Radiation Oncology | Admitting: Radiation Oncology

## 2015-12-18 DIAGNOSIS — C50912 Malignant neoplasm of unspecified site of left female breast: Secondary | ICD-10-CM | POA: Diagnosis not present

## 2015-12-19 ENCOUNTER — Ambulatory Visit
Admission: RE | Admit: 2015-12-19 | Discharge: 2015-12-19 | Disposition: A | Discharge status: Home / Self Care | Payer: Medicare Other | Source: Ambulatory Visit | Attending: Radiation Oncology | Admitting: Radiation Oncology

## 2015-12-19 DIAGNOSIS — C50912 Malignant neoplasm of unspecified site of left female breast: Secondary | ICD-10-CM | POA: Diagnosis not present

## 2015-12-20 DIAGNOSIS — C50912 Malignant neoplasm of unspecified site of left female breast: Secondary | ICD-10-CM | POA: Diagnosis not present

## 2015-12-22 ENCOUNTER — Inpatient Hospital Stay: Payer: Medicare Other

## 2015-12-22 ENCOUNTER — Ambulatory Visit: Payer: Medicare Other

## 2015-12-22 DIAGNOSIS — C50212 Malignant neoplasm of upper-inner quadrant of left female breast: Secondary | ICD-10-CM

## 2015-12-22 DIAGNOSIS — C50912 Malignant neoplasm of unspecified site of left female breast: Secondary | ICD-10-CM | POA: Diagnosis not present

## 2015-12-22 LAB — CBC
HCT: 43 % (ref 35.0–47.0)
Hemoglobin: 14.5 g/dL (ref 12.0–16.0)
MCH: 32.3 pg (ref 26.0–34.0)
MCHC: 33.6 g/dL (ref 32.0–36.0)
MCV: 95.9 fL (ref 80.0–100.0)
PLATELETS: 199 10*3/uL (ref 150–440)
RBC: 4.48 MIL/uL (ref 3.80–5.20)
RDW: 14.7 % — AB (ref 11.5–14.5)
WBC: 11.9 10*3/uL — ABNORMAL HIGH (ref 3.6–11.0)

## 2015-12-23 ENCOUNTER — Ambulatory Visit
Admission: RE | Admit: 2015-12-23 | Discharge: 2015-12-23 | Disposition: A | Discharge status: Home / Self Care | Payer: Medicare Other | Source: Ambulatory Visit | Attending: Radiation Oncology | Admitting: Radiation Oncology

## 2015-12-23 DIAGNOSIS — C50912 Malignant neoplasm of unspecified site of left female breast: Secondary | ICD-10-CM | POA: Diagnosis not present

## 2015-12-24 ENCOUNTER — Ambulatory Visit
Admission: RE | Admit: 2015-12-24 | Discharge: 2015-12-24 | Disposition: A | Discharge status: Home / Self Care | Payer: Medicare Other | Source: Ambulatory Visit | Attending: Radiation Oncology | Admitting: Radiation Oncology

## 2015-12-24 DIAGNOSIS — C50912 Malignant neoplasm of unspecified site of left female breast: Secondary | ICD-10-CM | POA: Diagnosis not present

## 2015-12-25 ENCOUNTER — Ambulatory Visit
Admission: RE | Admit: 2015-12-25 | Discharge: 2015-12-25 | Disposition: A | Discharge status: Home / Self Care | Payer: Medicare Other | Source: Ambulatory Visit | Attending: Radiation Oncology | Admitting: Radiation Oncology

## 2015-12-25 DIAGNOSIS — C50912 Malignant neoplasm of unspecified site of left female breast: Secondary | ICD-10-CM | POA: Diagnosis not present

## 2015-12-26 ENCOUNTER — Ambulatory Visit
Admission: RE | Admit: 2015-12-26 | Discharge: 2015-12-26 | Disposition: A | Discharge status: Home / Self Care | Payer: Medicare Other | Source: Ambulatory Visit | Attending: Radiation Oncology | Admitting: Radiation Oncology

## 2015-12-26 DIAGNOSIS — C50912 Malignant neoplasm of unspecified site of left female breast: Secondary | ICD-10-CM | POA: Diagnosis not present

## 2015-12-29 ENCOUNTER — Ambulatory Visit
Admission: RE | Admit: 2015-12-29 | Discharge: 2015-12-29 | Disposition: A | Discharge status: Home / Self Care | Payer: Medicare Other | Source: Ambulatory Visit | Attending: Radiation Oncology | Admitting: Radiation Oncology

## 2015-12-29 DIAGNOSIS — C50912 Malignant neoplasm of unspecified site of left female breast: Secondary | ICD-10-CM | POA: Diagnosis not present

## 2015-12-30 ENCOUNTER — Ambulatory Visit
Admission: RE | Admit: 2015-12-30 | Discharge: 2015-12-30 | Disposition: A | Discharge status: Home / Self Care | Payer: Medicare Other | Source: Ambulatory Visit | Attending: Radiation Oncology | Admitting: Radiation Oncology

## 2015-12-30 DIAGNOSIS — C50912 Malignant neoplasm of unspecified site of left female breast: Secondary | ICD-10-CM | POA: Diagnosis not present

## 2015-12-31 ENCOUNTER — Ambulatory Visit
Admission: RE | Admit: 2015-12-31 | Discharge: 2015-12-31 | Disposition: A | Discharge status: Home / Self Care | Payer: Medicare Other | Source: Ambulatory Visit | Attending: Radiation Oncology | Admitting: Radiation Oncology

## 2015-12-31 ENCOUNTER — Ambulatory Visit: Payer: Medicare Other

## 2015-12-31 DIAGNOSIS — C50912 Malignant neoplasm of unspecified site of left female breast: Secondary | ICD-10-CM | POA: Diagnosis not present

## 2016-01-01 ENCOUNTER — Inpatient Hospital Stay: Payer: Medicare Other | Attending: Oncology | Admitting: Oncology

## 2016-01-01 ENCOUNTER — Ambulatory Visit
Admission: RE | Admit: 2016-01-01 | Discharge: 2016-01-01 | Disposition: A | Discharge status: Home / Self Care | Payer: Medicare Other | Source: Ambulatory Visit | Attending: Radiation Oncology | Admitting: Radiation Oncology

## 2016-01-01 VITALS — BP 149/84 | HR 67 | Temp 97.4°F | Resp 18 | Wt 229.3 lb

## 2016-01-01 DIAGNOSIS — D72829 Elevated white blood cell count, unspecified: Secondary | ICD-10-CM | POA: Diagnosis not present

## 2016-01-01 DIAGNOSIS — J449 Chronic obstructive pulmonary disease, unspecified: Secondary | ICD-10-CM | POA: Insufficient documentation

## 2016-01-01 DIAGNOSIS — Z79811 Long term (current) use of aromatase inhibitors: Secondary | ICD-10-CM | POA: Insufficient documentation

## 2016-01-01 DIAGNOSIS — Z794 Long term (current) use of insulin: Secondary | ICD-10-CM

## 2016-01-01 DIAGNOSIS — Z170 Estrogen receptor positive status [ER+]: Secondary | ICD-10-CM | POA: Diagnosis not present

## 2016-01-01 DIAGNOSIS — K219 Gastro-esophageal reflux disease without esophagitis: Secondary | ICD-10-CM | POA: Diagnosis not present

## 2016-01-01 DIAGNOSIS — Z87891 Personal history of nicotine dependence: Secondary | ICD-10-CM | POA: Diagnosis not present

## 2016-01-01 DIAGNOSIS — Z923 Personal history of irradiation: Secondary | ICD-10-CM | POA: Diagnosis not present

## 2016-01-01 DIAGNOSIS — E669 Obesity, unspecified: Secondary | ICD-10-CM | POA: Diagnosis not present

## 2016-01-01 DIAGNOSIS — E785 Hyperlipidemia, unspecified: Secondary | ICD-10-CM | POA: Diagnosis not present

## 2016-01-01 DIAGNOSIS — C50912 Malignant neoplasm of unspecified site of left female breast: Secondary | ICD-10-CM | POA: Insufficient documentation

## 2016-01-01 DIAGNOSIS — Z780 Asymptomatic menopausal state: Secondary | ICD-10-CM

## 2016-01-01 DIAGNOSIS — I10 Essential (primary) hypertension: Secondary | ICD-10-CM | POA: Diagnosis not present

## 2016-01-01 DIAGNOSIS — E119 Type 2 diabetes mellitus without complications: Secondary | ICD-10-CM | POA: Diagnosis not present

## 2016-01-01 DIAGNOSIS — Z79899 Other long term (current) drug therapy: Secondary | ICD-10-CM | POA: Diagnosis not present

## 2016-01-01 DIAGNOSIS — E538 Deficiency of other specified B group vitamins: Secondary | ICD-10-CM | POA: Insufficient documentation

## 2016-01-01 MED ORDER — LETROZOLE 2.5 MG PO TABS: 2.5000 mg | ORAL_TABLET | Freq: Every day | ORAL | Status: DC

## 2016-01-01 NOTE — Progress Notes (Signed)
Patient finished XRT today.

## 2016-01-09 NOTE — Progress Notes (Signed)
[place]  Telephone:(336) [phone] Fax:(336) [phone]  ID: Robin Huff OB: [birth year]  MR#: [id number]  [id number]  Patient Care Team: Ezequiel Kayser, MD as PCP - General (Internal Medicine)  CHIEF COMPLAINT:  Chief Complaint  Patient presents with  . Breast Cancer    INTERVAL HISTORY: Patient returns to clinic today for further evaluation and initiation of an aromatase inhibitor. She completed her XRT earlier today and tolerated it well without significant side effects.  She currently feels well and is asymptomatic. She has no neurologic complaints. She denies any recent fevers. She has no chest pain or shortness of breath. She denies any nausea, vomiting, constipation, or diarrhea. She has no urinary complaints. Patient offers no specific complaints today.  REVIEW OF SYSTEMS:   Review of Systems  Constitutional: Negative for fever, weight loss and malaise/fatigue.  Respiratory: Negative.  Negative for shortness of breath.   Cardiovascular: Negative.  Negative for chest pain.  Gastrointestinal: Negative.   Genitourinary: Negative.   Musculoskeletal: Negative.   Neurological: Negative.  Negative for weakness.    As per HPI. Otherwise, a complete review of systems is negatve.  PAST MEDICAL HISTORY: Past Medical History  Diagnosis Date  . Cholelithiasis 05/27/2015    Requiring Cholecystectomy 08/14/2014- Dr. Pat Patrick   . BP (high blood pressure) 03/31/2014  . B12 deficiency 04/01/2014  . Acid reflux 03/31/2014  . Diabetes mellitus, type 2 ([place]) 03/31/2014  . Insomnia, persistent 04/01/2014  . 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 Abnormal finding on mammography 11/21/2012    Overview:  Biopsied 11/25/2012 by Dr. Pat Patrick; was due for screening mammogram February 2015.   Marland Kitchen Chronic LBP 05/02/2015  . Clinical depression 03/31/2014    Overview:  S/P suicide attempt 02/1995   . HLD (hyperlipidemia) 03/31/2014  . Apnea, sleep 03/31/2014    Overview:  on CPAP   . Obesity 05/27/2015  .  Cancer (HCC)     BREAST  . Dysrhythmia     h/o fluttering occ  . Anemia     h/o  . COPD (chronic obstructive pulmonary disease) ([place])     per cxr on 07-10-15    PAST SURGICAL HISTORY: Past Surgical History  Procedure Laterality Date  . Abdominal hysterectomy      Total  . Laparoscopic cholecystectomy  08/14/2010    Dr. Pat Patrick  . Back surgery      Multiple  . Neck surgery      Multiple  . Knee surgery Left   . Breast biopsy Right 06/20/09    neg  . Breast biopsy Left 12/11/12    neg  . Breast biopsy Left 06/04/2015  . Ulnar nerve repair    . Carpal tunnel release Right   . Breast lumpectomy with sentinel lymph node biopsy Left 07/18/2015    Procedure: BREAST LUMPECTOMY WITH SENTINEL LYMPH NODE BX;  Surgeon: Hubbard Robinson, MD;  Location: [facility] ORS;  Service: General;  Laterality: Left;  . Sentinel node biopsy Left 07/18/2015    Procedure: SENTINEL NODE BIOPSY/NEEDLE LOC;  Surgeon: Hubbard Robinson, MD;  Location: [facility] ORS;  Service: General;  Laterality: Left;  . Debridement and closure wound Left 10/01/2015    Procedure: DEBRIDEMENT AND CLOSURE WOUND;  Surgeon: Clayburn Pert, MD;  Location: [facility] ORS;  Service: General;  Laterality: Left;    FAMILY HISTORY Family History  Problem Relation Age of Onset  . Hypertension    . Stroke    . Diabetes    . Heart disease    .  Hypertension Mother   . Diabetes Brother        ADVANCED DIRECTIVES:    HEALTH MAINTENANCE: Social History  Substance Use Topics  . Smoking status: Former Smoker -- 1.00 packs/day for 40 years    Types: Cigarettes    Quit date: 09/07/2015  . Smokeless tobacco: Never Used  . Alcohol Use: No     Colonoscopy:  PAP:  Bone density:  Lipid panel:  Allergies  Allergen Reactions  . Chlorthalidone Other (See Comments)  . Ciprofloxacin Swelling    Swelling of hands and feet  . Codeine Nausea Only  . Diclofenac     Other reaction(s): Abdominal Pain  . Hydrochlorothiazide     Other reaction(s):  Other (See Comments) Leg cramps  . Adhesive [Tape] Rash    IF LEFT ON FOR LONG PERIODS-PAPER TAPE OK TO USE  . Betadine [Povidone Iodine] Rash    Current Outpatient Prescriptions  Medication Sig Dispense Refill  . amLODipine (NORVASC) 10 MG tablet Take 1 tablet by mouth every morning.   0  . atorvastatin (LIPITOR) 20 MG tablet Take 1 tablet by mouth at bedtime.    . carvedilol (COREG) 25 MG tablet Take 1 tablet by mouth 2 (two) times daily.    . Cholecalciferol (D-5000) 5000 UNITS TABS Take 1 tablet by mouth daily.    . cyanocobalamin (V-R VITAMIN B-12) 500 MCG tablet Take 1 tablet by mouth daily.    Marland Kitchen HYDROcodone-acetaminophen (NORCO/VICODIN) 5-325 MG tablet TK 1 T PO  Q 6 H PRF MODERATE PAIN  0  . insulin NPH-regular Human (NOVOLIN 70/30) (70-30) 100 UNIT/ML injection Inject 20-30 Units into the skin 2 (two) times daily. 25 units in am and 35 units in pm    . lisinopril (PRINIVIL,ZESTRIL) 40 MG tablet Take 1 tablet by mouth every morning.   0  . Multiple Vitamin (MULTIVITAMIN) capsule Take 1 capsule by mouth daily.    . naproxen sodium (RA NAPROXEN SODIUM) 220 MG tablet Take 1 tablet by mouth 2 (two) times daily.    Marland Kitchen omeprazole (PRILOSEC) 20 MG capsule Take 1 capsule by mouth every morning.     . traZODone (DESYREL) 150 MG tablet Take 1 tablet by mouth at bedtime.    Marland Kitchen letrozole (FEMARA) 2.5 MG tablet Take 1 tablet (2.5 mg total) by mouth daily. 30 tablet 11   No current facility-administered medications for this visit.    OBJECTIVE: Filed Vitals:   01/01/16 1508  BP: 149/84  Pulse: 67  Temp: 97.4 F (36.3 C)  Resp: 18     Body mass index is 35.9 kg/(m^2).    ECOG FS:0 - Asymptomatic  General: Well-developed, well-nourished, no acute distress. Eyes: Pink conjunctiva, anicteric sclera. Breasts: Left breast with no palpable lumps or masses. Exam deferred today. Lungs: Clear to auscultation bilaterally. Heart: Regular rate and rhythm. No rubs, murmurs, or gallops. Abdomen:  Soft, nontender, nondistended. No organomegaly noted, normoactive bowel sounds. Musculoskeletal: No edema, cyanosis, or clubbing. Neuro: Alert, answering all questions appropriately. Cranial nerves grossly intact. Skin: No rashes or petechiae noted. Psych: Normal affect.   LAB RESULTS:  Lab Results  Component Value Date   NA 138 07/10/2015   K 4.0 07/10/2015   CL 105 07/10/2015   CO2 27 07/10/2015   GLUCOSE 91 07/10/2015   BUN 18 07/10/2015   CREATININE 0.60 10/21/2015   CALCIUM 8.6* 07/10/2015   PROT 7.3 07/10/2015   ALBUMIN 3.6 07/10/2015   AST 17 07/10/2015   ALT 16 07/10/2015  ALKPHOS 62 07/10/2015   BILITOT 0.6 07/10/2015   GFRNONAA >60 07/10/2015   GFRAA >60 07/10/2015    Lab Results  Component Value Date   WBC 11.9* 12/22/2015   NEUTROABS 8.7* 07/10/2015   HGB 14.5 12/22/2015   HCT 43.0 12/22/2015   MCV 95.9 12/22/2015   PLT 199 12/22/2015     STUDIES: No results found.  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  PLAN:    1. Breast cancer: Final pathology results noted reviewed independently. Patient had low risk Oncotype score, therefore did not require adjuvant chemotherapy. She has now completed adjuvant XRT. Patient was given a prescription for letrozole today which you take daily for 5 years completing in March 2022. We will get baseline bone mineral density in the next 1-2 weeks. Return to clinic in 3 months for routine evaluation. 2. Leukocytosis: Unchanged.  Likely reactive, monitor.  Patient will return to clinic Patient expressed understanding and was in agreement with this plan. She also understands that She can call clinic at any time with any questions, concerns, or complaints.   Breast cancer, stage 1 ([place])   Staging form: Breast, AJCC 7th Edition     Clinical stage from 07/02/2015: Stage IA (T1c, N0, M0) - Signed by Lloyd Huger, MD on 07/02/2015   Lloyd Huger, MD   01/09/2016 7:54  AM

## 2016-02-04 ENCOUNTER — Encounter: Payer: Self-pay | Admitting: Oncology

## 2016-02-05 ENCOUNTER — Ambulatory Visit: Payer: Medicare Other | Admitting: Radiation Oncology

## 2016-02-11 ENCOUNTER — Encounter: Payer: Self-pay | Admitting: Radiation Oncology

## 2016-02-11 ENCOUNTER — Ambulatory Visit
Admission: RE | Admit: 2016-02-11 | Discharge: 2016-02-11 | Disposition: A | Discharge status: Home / Self Care | Payer: Medicare Other | Source: Ambulatory Visit | Attending: Radiation Oncology | Admitting: Radiation Oncology

## 2016-02-11 VITALS — BP 180/79 | HR 71 | Temp 97.3°F | Resp 20 | Wt 227.6 lb

## 2016-02-11 DIAGNOSIS — C50212 Malignant neoplasm of upper-inner quadrant of left female breast: Secondary | ICD-10-CM

## 2016-02-11 NOTE — Progress Notes (Signed)
Radiation Oncology Follow up Note  Name: Robin Huff   Date:   02/11/2016 MRN:  [id number] DOB: 1946/04/22    This 70 y.o. female presents to the clinic today for follow-up for stage IA ER/PR positive invasive mammary carcinoma the left breast status post wide local excision now 1 month out from whole breast radiation.  REFERRING PROVIDER: Ezequiel Kayser, MD  HPI: Patient is a 70 year old female now 1 month out having completed whole breast radiation to her left breast for stage Ia (T1 CN 0 M0) ER/PR positive HER-2/neu negative invasive mammary carcinoma status post wide local excision. Seen today in routine follow-up she is doing well.. Her Oncotype DX was low risk. She's currently on letrozole tolerating that well without side effect. She specifically denies breast tenderness cough or bone pain.  COMPLICATIONS OF TREATMENT: none  FOLLOW UP COMPLIANCE: keeps appointments   PHYSICAL EXAM:  BP 180/79 mmHg  Pulse 71  Temp(Src) 97.3 F (36.3 C)  Resp 20  Wt 227 lb 10 oz (103.25 kg) Lungs are clear to A&P cardiac examination essentially unremarkable with regular rate and rhythm. No dominant mass or nodularity is noted in either breast in 2 positions examined. Incision is well-healed. No axillary or supraclavicular adenopathy is appreciated. Cosmetic result is excellent. Well-developed well-nourished patient in NAD. HEENT reveals PERLA, EOMI, discs not visualized.  Oral cavity is clear. No oral mucosal lesions are identified. Neck is clear without evidence of cervical or supraclavicular adenopathy. Lungs are clear to A&P. Cardiac examination is essentially unremarkable with regular rate and rhythm without murmur rub or thrill. Abdomen is benign with no organomegaly or masses noted. Motor sensory and DTR levels are equal and symmetric in the upper and lower extremities. Cranial nerves II through XII are grossly intact. Proprioception is intact. No peripheral adenopathy or edema is identified. No  motor or sensory levels are noted. Crude visual fields are within normal range.  RADIOLOGY RESULTS: Mammograms will be ordered approximate 6 months  PLAN: Present time she is doing well only recovered 1 month out from whole breast radiation. I'm please were overall progress. She's aren't currently on letrozole tolerating that well without side effect. I have asked to see around 4-5 months for follow-up. She knows to call sooner with any concerns.  I would like to take this opportunity for allowing me to participate in the care of your patient.Armstead Peaks., MD

## 2016-03-29 IMAGING — MG MM LT PLC BREAST LOC DEV   1ST LESION  INC MAMMO GUIDE
5 series · 5 of 5 positions shown · non-contrast
Comparison: Previous exams.

CLINICAL DATA: 69-year-old female presenting for wire localization
of a malignancy in the left breast.

EXAM:
NEEDLE LOCALIZATION OF THE LEFT BREAST WITH MAMMO GUIDANCE

[L LM (1 of 3)]
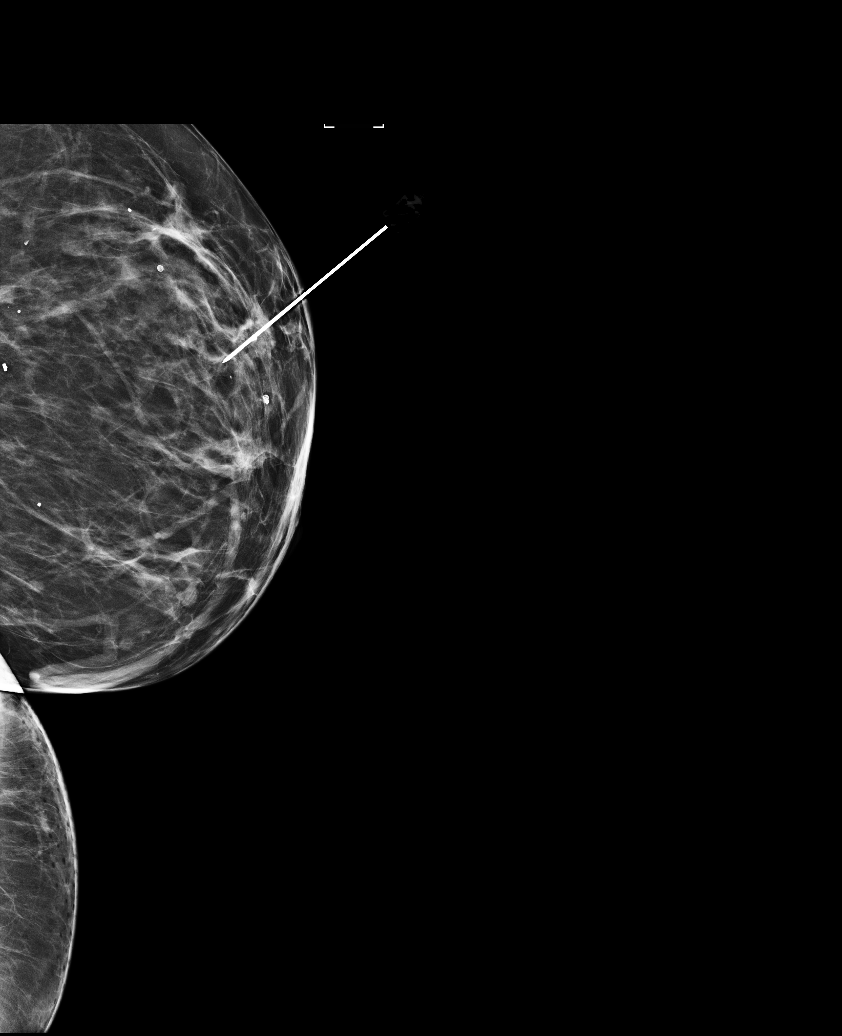

[L CC (1 of 2)]
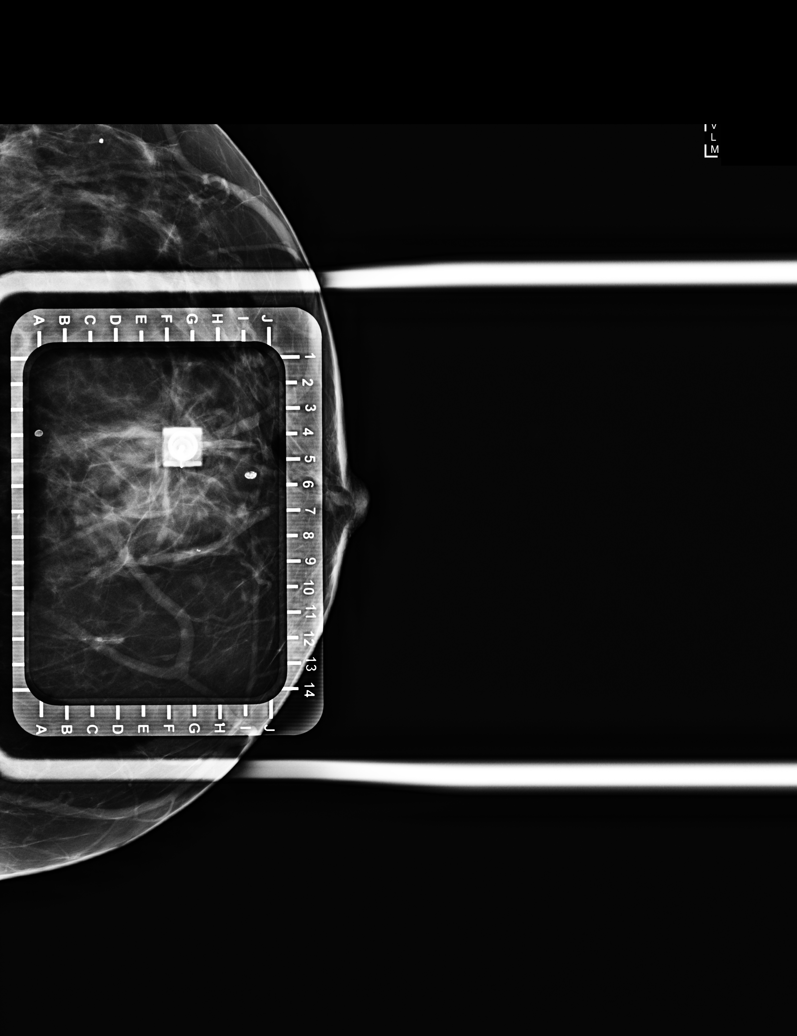

[L LM (2 of 3)]
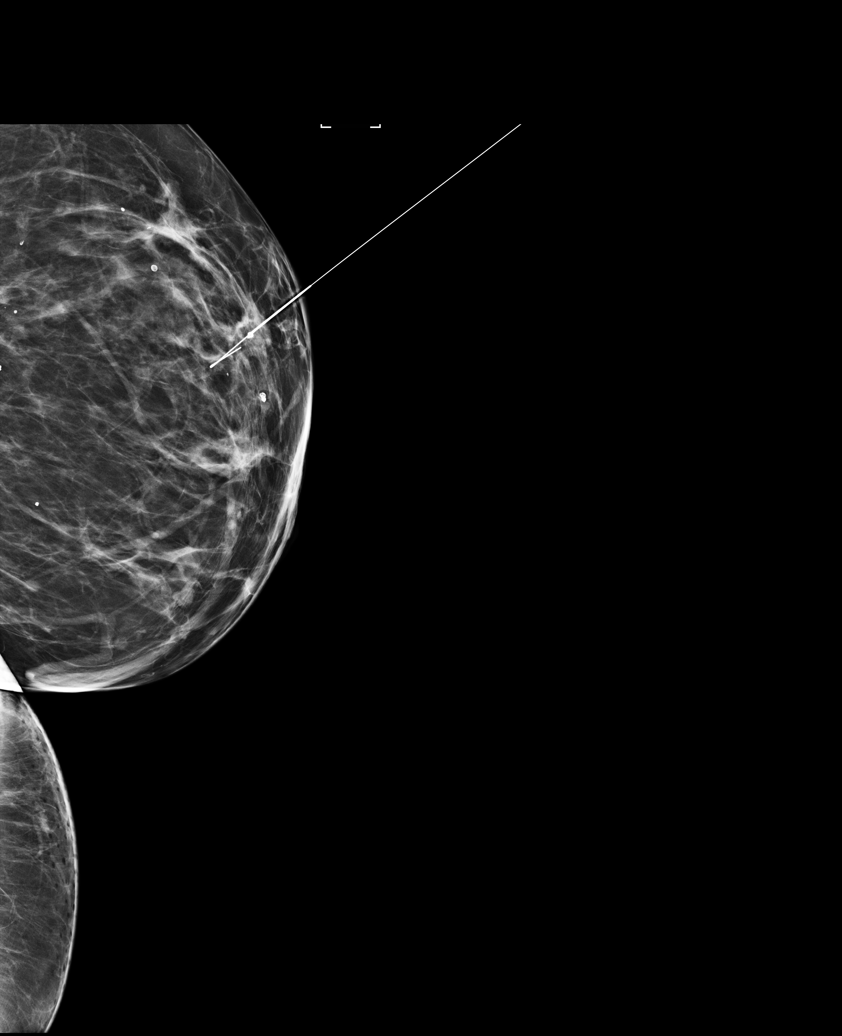

[L LM (3 of 3)]
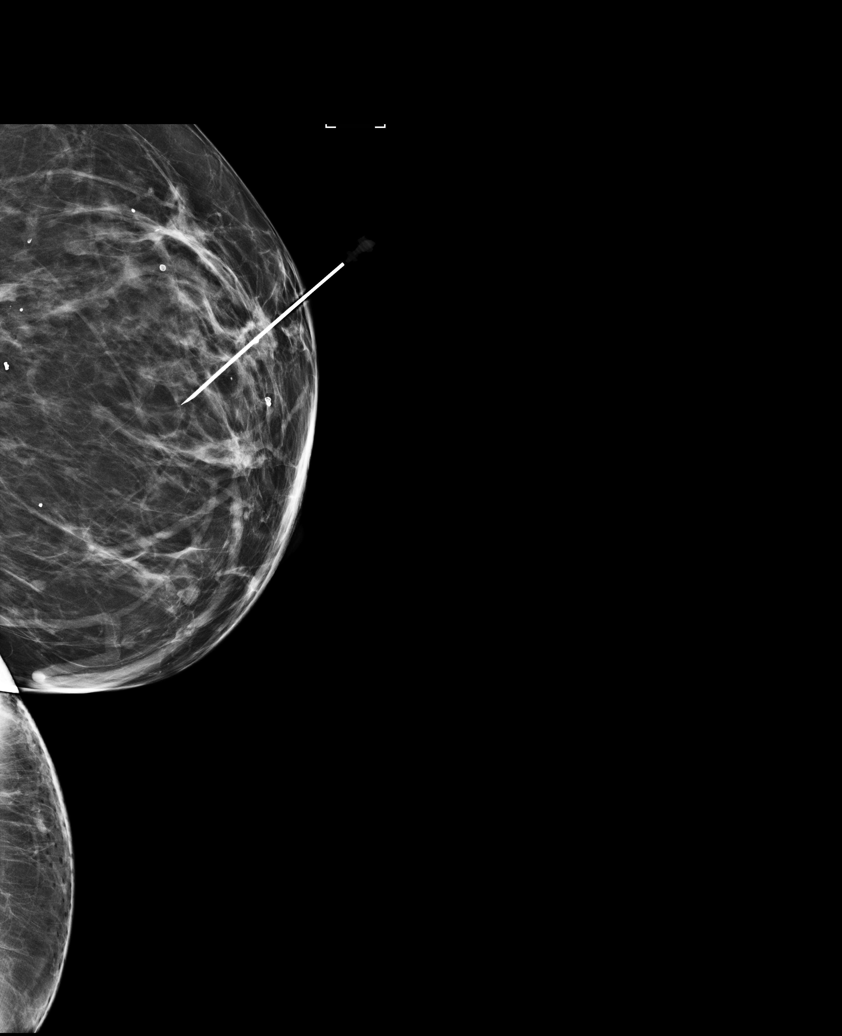

[L CC (2 of 2)]
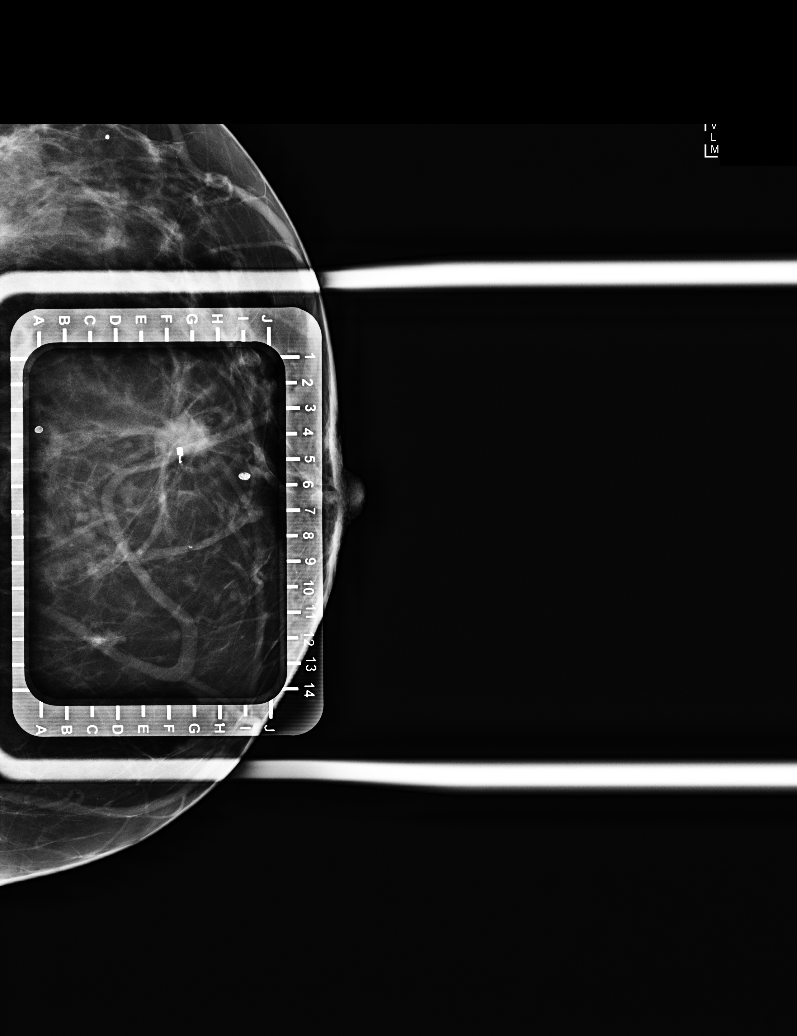

[5 of 5 positions shown; findings below may reference images not displayed]

FINDINGS: Patient presents for needle localization prior to lumpectomy of the
left breast. I met with the patient and we discussed the procedure
of needle localization including benefits and alternatives. We
discussed the high likelihood of a successful procedure. We
discussed the risks of the procedure, including infection, bleeding,
tissue injury, and further surgery. Informed, written consent was
given. The usual time-out protocol was performed immediately prior
to the procedure.

Using mammographic guidance, sterile technique, 2% lidocaine and a 5
cm modified Kopans needle, the coil shaped biopsy marking clip
within the mass at 12 o'clock in the left breast was localized using
a superior approach. The images were marked for Dr. Balwant.
IMPRESSION: Needle localization left breast. No apparent complications.

## 2016-04-02 ENCOUNTER — Inpatient Hospital Stay: Payer: Medicare Other | Attending: Oncology | Admitting: Oncology

## 2016-04-02 VITALS — BP 146/74 | HR 68 | Temp 98.9°F | Resp 18 | Wt 228.8 lb

## 2016-04-02 DIAGNOSIS — F17210 Nicotine dependence, cigarettes, uncomplicated: Secondary | ICD-10-CM | POA: Insufficient documentation

## 2016-04-02 DIAGNOSIS — E538 Deficiency of other specified B group vitamins: Secondary | ICD-10-CM | POA: Diagnosis not present

## 2016-04-02 DIAGNOSIS — Z79899 Other long term (current) drug therapy: Secondary | ICD-10-CM | POA: Diagnosis not present

## 2016-04-02 DIAGNOSIS — C50212 Malignant neoplasm of upper-inner quadrant of left female breast: Secondary | ICD-10-CM | POA: Insufficient documentation

## 2016-04-02 DIAGNOSIS — G4700 Insomnia, unspecified: Secondary | ICD-10-CM | POA: Insufficient documentation

## 2016-04-02 DIAGNOSIS — G5620 Lesion of ulnar nerve, unspecified upper limb: Secondary | ICD-10-CM | POA: Diagnosis not present

## 2016-04-02 DIAGNOSIS — E119 Type 2 diabetes mellitus without complications: Secondary | ICD-10-CM | POA: Insufficient documentation

## 2016-04-02 DIAGNOSIS — Z170 Estrogen receptor positive status [ER+]: Secondary | ICD-10-CM | POA: Insufficient documentation

## 2016-04-02 DIAGNOSIS — E785 Hyperlipidemia, unspecified: Secondary | ICD-10-CM | POA: Diagnosis not present

## 2016-04-02 DIAGNOSIS — Z794 Long term (current) use of insulin: Secondary | ICD-10-CM | POA: Insufficient documentation

## 2016-04-02 DIAGNOSIS — E669 Obesity, unspecified: Secondary | ICD-10-CM | POA: Insufficient documentation

## 2016-04-02 DIAGNOSIS — I10 Essential (primary) hypertension: Secondary | ICD-10-CM | POA: Insufficient documentation

## 2016-04-02 DIAGNOSIS — K219 Gastro-esophageal reflux disease without esophagitis: Secondary | ICD-10-CM | POA: Insufficient documentation

## 2016-04-02 DIAGNOSIS — F329 Major depressive disorder, single episode, unspecified: Secondary | ICD-10-CM | POA: Insufficient documentation

## 2016-04-02 DIAGNOSIS — J449 Chronic obstructive pulmonary disease, unspecified: Secondary | ICD-10-CM | POA: Insufficient documentation

## 2016-04-02 DIAGNOSIS — D72829 Elevated white blood cell count, unspecified: Secondary | ICD-10-CM | POA: Insufficient documentation

## 2016-04-02 DIAGNOSIS — C50912 Malignant neoplasm of unspecified site of left female breast: Secondary | ICD-10-CM

## 2016-04-02 DIAGNOSIS — Z79811 Long term (current) use of aromatase inhibitors: Secondary | ICD-10-CM | POA: Insufficient documentation

## 2016-04-02 NOTE — Progress Notes (Signed)
Offers no complaints  

## 2016-04-02 NOTE — Progress Notes (Signed)
Robin Huff  Telephone:(336) [phone] Fax:(336) [phone]  ID: Robin Huff OB: 09/09/1946  MR#: [id number]  [id number]  Patient Care Team: Ezequiel Kayser, MD as PCP - General (Internal Medicine)  CHIEF COMPLAINT: Pathologic stage IA ER/PR positive adenocarcinoma of the upper inner left breast.  INTERVAL HISTORY: Patient returns to clinic today for routine 3 month evaluation. She is tolerating letrozole well without significant side effects.  She currently feels well and is asymptomatic. She has no neurologic complaints. She denies any recent fevers. She has no chest pain or shortness of breath. She denies any nausea, vomiting, constipation, or diarrhea. She has no urinary complaints. Patient offers no specific complaints today.  REVIEW OF SYSTEMS:   Review of Systems  Constitutional: Negative for fever, weight loss and malaise/fatigue.  Respiratory: Negative.  Negative for shortness of breath.   Cardiovascular: Negative.  Negative for chest pain.  Gastrointestinal: Negative.   Genitourinary: Negative.   Musculoskeletal: Negative.   Neurological: Negative.  Negative for weakness.    As per HPI. Otherwise, a complete review of systems is negatve.  PAST MEDICAL HISTORY: Past Medical History  Diagnosis Date  . Cholelithiasis 05/27/2015    Requiring Cholecystectomy 08/14/2014- Dr. Pat Patrick   . BP (high blood pressure) 03/31/2014  . B12 deficiency 04/01/2014  . Acid reflux 03/31/2014  . Diabetes mellitus, type 2 ([place]) 03/31/2014  . Insomnia, persistent 04/01/2014  . 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 Abnormal finding on mammography 11/21/2012    Overview:  Biopsied 11/25/2012 by Dr. Pat Patrick; was due for screening mammogram February 2015.   Marland Kitchen Chronic LBP 05/02/2015  . Clinical depression 03/31/2014    Overview:  S/P suicide attempt 02/1995   . HLD (hyperlipidemia) 03/31/2014  . Apnea, sleep 03/31/2014    Overview:  on CPAP   . Obesity 05/27/2015  . Cancer (HCC)     BREAST  . Dysrhythmia      h/o fluttering occ  . Anemia     h/o  . COPD (chronic obstructive pulmonary disease) ([place])     per cxr on 07-10-15    PAST SURGICAL HISTORY: Past Surgical History  Procedure Laterality Date  . Abdominal hysterectomy      Total  . Laparoscopic cholecystectomy  08/14/2010    Dr. Pat Patrick  . Back surgery      Multiple  . Neck surgery      Multiple  . Knee surgery Left   . Breast biopsy Right 06/20/09    neg  . Breast biopsy Left 12/11/12    neg  . Breast biopsy Left 06/04/2015  . Ulnar nerve repair    . Carpal tunnel release Right   . Breast lumpectomy with sentinel lymph node biopsy Left 07/18/2015    Procedure: BREAST LUMPECTOMY WITH SENTINEL LYMPH NODE BX;  Surgeon: Hubbard Robinson, MD;  Location: [facility];  Service: General;  Laterality: Left;  . Sentinel node biopsy Left 07/18/2015    Procedure: SENTINEL NODE BIOPSY/NEEDLE LOC;  Surgeon: Hubbard Robinson, MD;  Location: [facility];  Service: General;  Laterality: Left;  . Debridement and closure wound Left 10/01/2015    Procedure: DEBRIDEMENT AND CLOSURE WOUND;  Surgeon: Clayburn Pert, MD;  Location: [facility] ORS;  Service: General;  Laterality: Left;    FAMILY HISTORY Family History  Problem Relation Age of Onset  . Hypertension    . Stroke    . Diabetes    . Heart disease    . Hypertension Mother   . Diabetes Brother  ADVANCED DIRECTIVES:    HEALTH MAINTENANCE: Social History  Substance Use Topics  . Smoking status: Former Smoker -- 1.00 packs/day for 40 years    Types: Cigarettes    Quit date: 09/07/2015  . Smokeless tobacco: Never Used  . Alcohol Use: No     Colonoscopy:  PAP:  Bone density:  Lipid panel:  Allergies  Allergen Reactions  . Chlorthalidone Other (See Comments)  . Ciprofloxacin Swelling    Swelling of hands and feet  . Codeine Nausea Only  . Diclofenac     Other reaction(s): Abdominal Pain  . Hydrochlorothiazide     Other reaction(s): Other (See Comments) Leg cramps  .  Adhesive [Tape] Rash    IF LEFT ON FOR LONG PERIODS-PAPER TAPE OK TO USE  . Betadine [Povidone Iodine] Rash    Current Outpatient Prescriptions  Medication Sig Dispense Refill  . amLODipine (NORVASC) 10 MG tablet Take 1 tablet by mouth every morning.   0  . atorvastatin (LIPITOR) 20 MG tablet Take 1 tablet by mouth at bedtime.    . carvedilol (COREG) 25 MG tablet Take 1 tablet by mouth 2 (two) times daily.    . cetirizine (ZYRTEC) 10 MG tablet Take 1 tablet by mouth daily as needed.    . Cholecalciferol (D-5000) 5000 UNITS TABS Take 1 tablet by mouth daily.    . cyanocobalamin (V-R VITAMIN B-12) 500 MCG tablet Take 1 tablet by mouth daily.    Marland Kitchen HYDROcodone-acetaminophen (NORCO/VICODIN) 5-325 MG tablet TK 1 T PO  Q 6 H PRF MODERATE PAIN  0  . insulin NPH-regular Human (NOVOLIN 70/30) (70-30) 100 UNIT/ML injection Inject 20-30 Units into the skin 2 (two) times daily. 25 units in am and 35 units in pm    . letrozole (FEMARA) 2.5 MG tablet Take 1 tablet (2.5 mg total) by mouth daily. 30 tablet 11  . lisinopril (PRINIVIL,ZESTRIL) 40 MG tablet Take 1 tablet by mouth every morning.   0  . Multiple Vitamin (MULTIVITAMIN) capsule Take 1 capsule by mouth daily.    . naproxen sodium (RA NAPROXEN SODIUM) 220 MG tablet Take 1 tablet by mouth 2 (two) times daily.    Marland Kitchen omeprazole (PRILOSEC) 20 MG capsule Take 1 capsule by mouth every morning.     . traZODone (DESYREL) 150 MG tablet Take 1 tablet by mouth at bedtime.     No current facility-administered medications for this visit.    OBJECTIVE: Filed Vitals:   04/02/16 1044  BP: 146/74  Pulse: 68  Temp: 98.9 F (37.2 C)  Resp: 18     Body mass index is 35.83 kg/(m^2).    ECOG FS:0 - Asymptomatic  General: Well-developed, well-nourished, no acute distress. Eyes: Pink conjunctiva, anicteric sclera. Breasts: Left breast with no palpable lumps or masses. Exam deferred today. Lungs: Clear to auscultation bilaterally. Heart: Regular rate and rhythm.  No rubs, murmurs, or gallops. Abdomen: Soft, nontender, nondistended. No organomegaly noted, normoactive bowel sounds. Musculoskeletal: No edema, cyanosis, or clubbing. Neuro: Alert, answering all questions appropriately. Cranial nerves grossly intact. Skin: No rashes or petechiae noted. Psych: Normal affect.   LAB RESULTS:  Lab Results  Component Value Date   NA 138 07/10/2015   K 4.0 07/10/2015   CL 105 07/10/2015   CO2 27 07/10/2015   GLUCOSE 91 07/10/2015   BUN 18 07/10/2015   CREATININE 0.60 10/21/2015   CALCIUM 8.6* 07/10/2015   PROT 7.3 07/10/2015   ALBUMIN 3.6 07/10/2015   AST 17 07/10/2015   ALT  16 07/10/2015   ALKPHOS 62 07/10/2015   BILITOT 0.6 07/10/2015   GFRNONAA >60 07/10/2015   GFRAA >60 07/10/2015    Lab Results  Component Value Date   WBC 11.9* 12/22/2015   NEUTROABS 8.7* 07/10/2015   HGB 14.5 12/22/2015   HCT 43.0 12/22/2015   MCV 95.9 12/22/2015   PLT 199 12/22/2015     STUDIES: No results found.  ASSESSMENT: Pathologic stage IA ER/PR positive adenocarcinoma of the upper inner left breast.  PLAN:    1. Pathologic stage Ia ER/PR positive adenocarcinoma of the upper inner left breast: Final pathology results reviewed independently. Patient had low risk Oncotype score, therefore did not require adjuvant chemotherapy. She has now completed adjuvant XRT. Continue letrozole daily for 5 years completing in March 2022. Will get baseline bone mineral density in the next 1-2 weeks. Patient's next mammogram will be scheduled in September 2017. Return to clinic in 3 months for routine evaluation at which point patient can possibly be switched to evaluation every 6 months.  2. Leukocytosis: Unchanged.  Likely reactive, monitor.  Patient will return to clinic Patient expressed understanding and was in agreement with this plan. She also understands that She can call clinic at any time with any questions, concerns, or complaints.    Lloyd Huger, MD    04/02/2016 11:34 AM

## 2016-04-12 ENCOUNTER — Ambulatory Visit
Admission: RE | Admit: 2016-04-12 | Discharge: 2016-04-12 | Disposition: A | Discharge status: Home / Self Care | Payer: Medicare Other | Source: Ambulatory Visit | Attending: Oncology | Admitting: Oncology

## 2016-04-12 DIAGNOSIS — Z780 Asymptomatic menopausal state: Secondary | ICD-10-CM | POA: Diagnosis not present

## 2016-04-12 DIAGNOSIS — Z853 Personal history of malignant neoplasm of breast: Secondary | ICD-10-CM | POA: Diagnosis not present

## 2016-04-12 DIAGNOSIS — M85852 Other specified disorders of bone density and structure, left thigh: Secondary | ICD-10-CM | POA: Diagnosis not present

## 2016-06-02 ENCOUNTER — Ambulatory Visit
Admission: RE | Admit: 2016-06-02 | Discharge: 2016-06-02 | Disposition: A | Discharge status: Home / Self Care | Payer: Medicare Other | Source: Ambulatory Visit | Attending: Oncology | Admitting: Oncology

## 2016-06-02 ENCOUNTER — Other Ambulatory Visit: Payer: Self-pay | Admitting: Oncology

## 2016-06-02 DIAGNOSIS — C50912 Malignant neoplasm of unspecified site of left female breast: Secondary | ICD-10-CM | POA: Diagnosis not present

## 2016-06-02 HISTORY — DX: Malignant neoplasm of unspecified site of unspecified female breast: C50.919

## 2016-06-02 HISTORY — DX: Personal history of irradiation: Z92.3

## 2016-06-30 NOTE — Progress Notes (Signed)
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  INTERVAL HISTORY: Patient returns to clinic today for routine 3 month evaluation. She continues to tolerate letrozole well without significant side effects.  She currently feels well and is asymptomatic. She has no neurologic complaints. She denies any recent fevers. She has no chest pain or shortness of breath. She denies any nausea, vomiting, constipation, or diarrhea. She has no urinary complaints. Patient offers no specific complaints today.  REVIEW OF SYSTEMS:   Review of Systems  Constitutional: Negative for fever, malaise/fatigue and weight loss.  Respiratory: Negative.  Negative for cough and shortness of breath.   Cardiovascular: Negative.  Negative for chest pain and leg swelling.  Gastrointestinal: Negative.  Negative for abdominal pain.  Genitourinary: Negative.   Musculoskeletal: Negative.   Neurological: Negative.  Negative for sensory change and weakness.  Psychiatric/Behavioral: Negative.  The patient is not nervous/anxious.     As per HPI. Otherwise, a complete review of systems is negative.  PAST MEDICAL HISTORY: Past Medical History:  Diagnosis Date  . Abnormal finding on mammography 11/21/2012   Overview:  Biopsied 11/25/2012 by Dr. Pat Patrick; was due for screening mammogram February 2015.   Marland Kitchen Acid reflux 03/31/2014  . Anemia    h/o  . Apnea, sleep 03/31/2014   Overview:  on CPAP   . B12 deficiency 04/01/2014  . BP (high blood pressure) 03/31/2014  . Breast cancer ([place])   . Cancer (HCC)    BREAST  . Cholelithiasis 05/27/2015   Requiring Cholecystectomy 08/14/2014- Dr. Pat Patrick   . Chronic LBP 05/02/2015  . Clinical depression 03/31/2014   Overview:   S/P suicide attempt 02/1995   . COPD (chronic obstructive pulmonary disease) ([place])    per cxr on 07-10-15  . Diabetes mellitus, type 2 ([place]) 03/31/2014  . Dysrhythmia    h/o fluttering occ  . HLD (hyperlipidemia) 03/31/2014  . Insomnia, persistent 04/01/2014  . Obesity 05/27/2015  . Personal history of radiation therapy   . 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 ABDOMINAL HYSTERECTOMY     Total  . BACK SURGERY     Multiple  . BREAST BIOPSY Right 06/20/09   neg  . BREAST BIOPSY Left 12/11/12   neg  . BREAST BIOPSY Left 06/04/2015  . BREAST LUMPECTOMY WITH SENTINEL LYMPH NODE BIOPSY Left 07/18/2015   Procedure: BREAST LUMPECTOMY WITH SENTINEL LYMPH NODE BX;  Surgeon: Hubbard Robinson, MD;  Location: [facility] ORS;  Service: General;  Laterality: Left;  . CARPAL TUNNEL RELEASE Right   . DEBRIDEMENT AND CLOSURE WOUND Left 10/01/2015   Procedure: DEBRIDEMENT AND CLOSURE WOUND;  Surgeon: Clayburn Pert, MD;  Location: [facility] ORS;  Service: General;  Laterality: Left;  . KNEE SURGERY Left   . LAPAROSCOPIC CHOLECYSTECTOMY  08/14/2010   Dr. Pat Patrick  . NECK SURGERY     Multiple  . SENTINEL NODE BIOPSY Left 07/18/2015   Procedure: SENTINEL NODE BIOPSY/NEEDLE LOC;  Surgeon: Hubbard Robinson, MD;  Location: [facility] ORS;  Service: General;  Laterality: Left;  . ULNAR NERVE REPAIR      FAMILY HISTORY Family History  Problem Relation Age of Onset  . Hypertension    . Stroke    .  Diabetes    . Heart disease    . Hypertension Mother   . Diabetes Brother   . Breast cancer Neg Hx        ADVANCED DIRECTIVES:    HEALTH MAINTENANCE: Social History  Substance Use Topics  . Smoking status: Former Smoker    Packs/day: 1.00    Years: 40.00    Types: Cigarettes    Quit date: 09/07/2015  . Smokeless tobacco: Never Used  . Alcohol use No     Colonoscopy:  PAP:  Bone density:  Lipid panel:  Allergies  Allergen Reactions  .  Chlorthalidone Other (See Comments)  . Ciprofloxacin Swelling    Swelling of hands and feet  . Codeine Nausea Only  . Diclofenac     Other reaction(s): Abdominal Pain  . Hydrochlorothiazide     Other reaction(s): Other (See Comments) Leg cramps  . Adhesive [Tape] Rash    IF LEFT ON FOR LONG PERIODS-PAPER TAPE OK TO USE  . Betadine [Povidone Iodine] Rash    Current Outpatient Prescriptions  Medication Sig Dispense Refill  . amLODipine (NORVASC) 10 MG tablet Take 1 tablet by mouth every morning.   0  . atorvastatin (LIPITOR) 20 MG tablet Take 1 tablet by mouth at bedtime.    . carvedilol (COREG) 25 MG tablet Take 1 tablet by mouth 2 (two) times daily.    . cetirizine (ZYRTEC) 10 MG tablet Take 1 tablet by mouth daily as needed.    . Cholecalciferol (D-5000) 5000 UNITS TABS Take 1 tablet by mouth daily.    . cyanocobalamin (V-R VITAMIN B-12) 500 MCG tablet Take 1 tablet by mouth daily.    Marland Kitchen HYDROcodone-acetaminophen (NORCO/VICODIN) 5-325 MG tablet TK 1 T PO  Q 6 H PRF MODERATE PAIN  0  . insulin NPH-regular Human (NOVOLIN 70/30) (70-30) 100 UNIT/ML injection Inject 20-30 Units into the skin 2 (two) times daily. 25 units in am and 35 units in pm    . letrozole (FEMARA) 2.5 MG tablet Take 1 tablet (2.5 mg total) by mouth daily. 30 tablet 11  . lisinopril (PRINIVIL,ZESTRIL) 40 MG tablet Take 1 tablet by mouth every morning.   0  . Multiple Vitamin (MULTIVITAMIN) capsule Take 1 capsule by mouth daily.    . naproxen sodium (RA NAPROXEN SODIUM) 220 MG tablet Take 1 tablet by mouth 2 (two) times daily.    Marland Kitchen omeprazole (PRILOSEC) 20 MG capsule Take 1 capsule by mouth every morning.     . traZODone (DESYREL) 150 MG tablet Take 1 tablet by mouth at bedtime.     No current facility-administered medications for this visit.     OBJECTIVE: Vitals:   07/02/16 1113  BP: (!) 159/80  Pulse: 69  Resp: 18  Temp: 98.6 F (37 C)     Body mass index is 36.05 kg/m.    ECOG FS:0 -  Asymptomatic  General: Well-developed, well-nourished, no acute distress. Eyes: Pink conjunctiva, anicteric sclera. Breasts: Left breast with no palpable lumps or masses. Exam recently performed by another provider.  Lungs: Clear to auscultation bilaterally. Heart: Regular rate and rhythm. No rubs, murmurs, or gallops. Abdomen: Soft, nontender, nondistended. No organomegaly noted, normoactive bowel sounds. Musculoskeletal: No edema, cyanosis, or clubbing. Neuro: Alert, answering all questions appropriately. Cranial nerves grossly intact. Skin: No rashes or petechiae noted. Psych: Normal affect.   LAB RESULTS:  Lab Results  Component Value Date   NA 138 07/10/2015   K 4.0 07/10/2015   CL 105 07/10/2015  CO2 27 07/10/2015   GLUCOSE 91 07/10/2015   BUN 18 07/10/2015   CREATININE 0.60 10/21/2015   CALCIUM 8.6 (L) 07/10/2015   PROT 7.3 07/10/2015   ALBUMIN 3.6 07/10/2015   AST 17 07/10/2015   ALT 16 07/10/2015   ALKPHOS 62 07/10/2015   BILITOT 0.6 07/10/2015   GFRNONAA >60 07/10/2015   GFRAA >60 07/10/2015    Lab Results  Component Value Date   WBC 11.9 (H) 12/22/2015   NEUTROABS 8.7 (H) 07/10/2015   HGB 14.5 12/22/2015   HCT 43.0 12/22/2015   MCV 95.9 12/22/2015   PLT 199 12/22/2015     STUDIES: No results found.  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  PLAN:    1. 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 Patient had low risk Oncotype score, therefore did not require adjuvant chemotherapy. Continue letrozole daily for 5 years completing in March 2022. Her most recent mammogram on June 02, 2016 was reported as BI-RADS 2, repeat in one year. Return to clinic in 6 months for routine evaluation. 2. Osteopenia: Bone marrow density on April 12, 2016 reported T score of -1.1. Repeat in one  year.  Patient will return to clinic Patient expressed understanding and was in agreement with this plan. She also understands that She can call clinic at any time with any questions, concerns, or complaints.    Lloyd Huger, MD   07/04/2016 5:03 PM

## 2016-07-02 ENCOUNTER — Inpatient Hospital Stay: Payer: Medicare Other | Attending: Oncology | Admitting: Oncology

## 2016-07-02 VITALS — BP 159/80 | HR 69 | Temp 98.6°F | Resp 18 | Wt 230.2 lb

## 2016-07-02 DIAGNOSIS — M8580 Other specified disorders of bone density and structure, unspecified site: Secondary | ICD-10-CM | POA: Insufficient documentation

## 2016-07-02 DIAGNOSIS — Z923 Personal history of irradiation: Secondary | ICD-10-CM | POA: Diagnosis not present

## 2016-07-02 DIAGNOSIS — F329 Major depressive disorder, single episode, unspecified: Secondary | ICD-10-CM | POA: Insufficient documentation

## 2016-07-02 DIAGNOSIS — E785 Hyperlipidemia, unspecified: Secondary | ICD-10-CM | POA: Diagnosis not present

## 2016-07-02 DIAGNOSIS — G4730 Sleep apnea, unspecified: Secondary | ICD-10-CM | POA: Insufficient documentation

## 2016-07-02 DIAGNOSIS — Z79811 Long term (current) use of aromatase inhibitors: Secondary | ICD-10-CM | POA: Insufficient documentation

## 2016-07-02 DIAGNOSIS — Z79899 Other long term (current) drug therapy: Secondary | ICD-10-CM | POA: Diagnosis not present

## 2016-07-02 DIAGNOSIS — G8929 Other chronic pain: Secondary | ICD-10-CM | POA: Insufficient documentation

## 2016-07-02 DIAGNOSIS — E669 Obesity, unspecified: Secondary | ICD-10-CM | POA: Insufficient documentation

## 2016-07-02 DIAGNOSIS — E119 Type 2 diabetes mellitus without complications: Secondary | ICD-10-CM | POA: Insufficient documentation

## 2016-07-02 DIAGNOSIS — E538 Deficiency of other specified B group vitamins: Secondary | ICD-10-CM | POA: Diagnosis not present

## 2016-07-02 DIAGNOSIS — Z794 Long term (current) use of insulin: Secondary | ICD-10-CM | POA: Insufficient documentation

## 2016-07-02 DIAGNOSIS — K219 Gastro-esophageal reflux disease without esophagitis: Secondary | ICD-10-CM | POA: Insufficient documentation

## 2016-07-02 DIAGNOSIS — J449 Chronic obstructive pulmonary disease, unspecified: Secondary | ICD-10-CM | POA: Diagnosis not present

## 2016-07-02 DIAGNOSIS — I499 Cardiac arrhythmia, unspecified: Secondary | ICD-10-CM | POA: Diagnosis not present

## 2016-07-02 DIAGNOSIS — Z9049 Acquired absence of other specified parts of digestive tract: Secondary | ICD-10-CM | POA: Diagnosis not present

## 2016-07-02 DIAGNOSIS — Z87891 Personal history of nicotine dependence: Secondary | ICD-10-CM

## 2016-07-02 DIAGNOSIS — C50212 Malignant neoplasm of upper-inner quadrant of left female breast: Secondary | ICD-10-CM | POA: Diagnosis present

## 2016-07-02 DIAGNOSIS — Z862 Personal history of diseases of the blood and blood-forming organs and certain disorders involving the immune mechanism: Secondary | ICD-10-CM

## 2016-07-02 DIAGNOSIS — M545 Low back pain: Secondary | ICD-10-CM | POA: Diagnosis not present

## 2016-07-02 DIAGNOSIS — Z915 Personal history of self-harm: Secondary | ICD-10-CM | POA: Diagnosis not present

## 2016-07-02 DIAGNOSIS — Z170 Estrogen receptor positive status [ER+]: Secondary | ICD-10-CM | POA: Diagnosis not present

## 2016-07-02 NOTE — Progress Notes (Signed)
States continues to have chronic back pain but feeling well. Offers no complaints.

## 2016-07-14 ENCOUNTER — Ambulatory Visit: Payer: Medicare Other | Attending: Radiation Oncology | Admitting: Radiation Oncology

## 2016-12-27 ENCOUNTER — Emergency Department: Payer: Medicare Other

## 2016-12-27 ENCOUNTER — Inpatient Hospital Stay
Admission: EM | Admit: 2016-12-27 | Discharge: 2016-12-31 | DRG: 291 | Disposition: A | Discharge status: Home / Self Care | Payer: Medicare Other | Attending: Internal Medicine | Admitting: Internal Medicine

## 2016-12-27 ENCOUNTER — Encounter: Payer: Self-pay | Admitting: Emergency Medicine

## 2016-12-27 DIAGNOSIS — I4891 Unspecified atrial fibrillation: Secondary | ICD-10-CM | POA: Diagnosis present

## 2016-12-27 DIAGNOSIS — Z881 Allergy status to other antibiotic agents status: Secondary | ICD-10-CM | POA: Diagnosis not present

## 2016-12-27 DIAGNOSIS — Z794 Long term (current) use of insulin: Secondary | ICD-10-CM | POA: Diagnosis not present

## 2016-12-27 DIAGNOSIS — J9601 Acute respiratory failure with hypoxia: Secondary | ICD-10-CM | POA: Diagnosis present

## 2016-12-27 DIAGNOSIS — E119 Type 2 diabetes mellitus without complications: Secondary | ICD-10-CM | POA: Diagnosis present

## 2016-12-27 DIAGNOSIS — Z888 Allergy status to other drugs, medicaments and biological substances status: Secondary | ICD-10-CM | POA: Diagnosis not present

## 2016-12-27 DIAGNOSIS — E785 Hyperlipidemia, unspecified: Secondary | ICD-10-CM | POA: Diagnosis present

## 2016-12-27 DIAGNOSIS — Z923 Personal history of irradiation: Secondary | ICD-10-CM | POA: Diagnosis not present

## 2016-12-27 DIAGNOSIS — Z885 Allergy status to narcotic agent status: Secondary | ICD-10-CM | POA: Diagnosis not present

## 2016-12-27 DIAGNOSIS — Z886 Allergy status to analgesic agent status: Secondary | ICD-10-CM

## 2016-12-27 DIAGNOSIS — R0602 Shortness of breath: Secondary | ICD-10-CM

## 2016-12-27 DIAGNOSIS — G4730 Sleep apnea, unspecified: Secondary | ICD-10-CM | POA: Diagnosis present

## 2016-12-27 DIAGNOSIS — M1990 Unspecified osteoarthritis, unspecified site: Secondary | ICD-10-CM | POA: Diagnosis present

## 2016-12-27 DIAGNOSIS — K219 Gastro-esophageal reflux disease without esophagitis: Secondary | ICD-10-CM | POA: Diagnosis present

## 2016-12-27 DIAGNOSIS — Z9989 Dependence on other enabling machines and devices: Secondary | ICD-10-CM | POA: Diagnosis not present

## 2016-12-27 DIAGNOSIS — I110 Hypertensive heart disease with heart failure: Secondary | ICD-10-CM | POA: Diagnosis not present

## 2016-12-27 DIAGNOSIS — R0902 Hypoxemia: Secondary | ICD-10-CM

## 2016-12-27 DIAGNOSIS — Z883 Allergy status to other anti-infective agents status: Secondary | ICD-10-CM | POA: Diagnosis not present

## 2016-12-27 DIAGNOSIS — I509 Heart failure, unspecified: Secondary | ICD-10-CM

## 2016-12-27 DIAGNOSIS — Z79899 Other long term (current) drug therapy: Secondary | ICD-10-CM | POA: Diagnosis not present

## 2016-12-27 DIAGNOSIS — R7989 Other specified abnormal findings of blood chemistry: Secondary | ICD-10-CM | POA: Diagnosis present

## 2016-12-27 DIAGNOSIS — Z91048 Other nonmedicinal substance allergy status: Secondary | ICD-10-CM | POA: Diagnosis not present

## 2016-12-27 DIAGNOSIS — J449 Chronic obstructive pulmonary disease, unspecified: Secondary | ICD-10-CM | POA: Diagnosis present

## 2016-12-27 DIAGNOSIS — I5033 Acute on chronic diastolic (congestive) heart failure: Secondary | ICD-10-CM | POA: Diagnosis present

## 2016-12-27 DIAGNOSIS — Z87891 Personal history of nicotine dependence: Secondary | ICD-10-CM | POA: Diagnosis not present

## 2016-12-27 DIAGNOSIS — Z7982 Long term (current) use of aspirin: Secondary | ICD-10-CM | POA: Diagnosis not present

## 2016-12-27 DIAGNOSIS — F329 Major depressive disorder, single episode, unspecified: Secondary | ICD-10-CM | POA: Diagnosis present

## 2016-12-27 LAB — CBC WITH DIFFERENTIAL/PLATELET
Basophils Absolute: 0.1 10*3/uL (ref 0–0.1)
Basophils Relative: 1 %
Eosinophils Absolute: 0.2 10*3/uL (ref 0–0.7)
Eosinophils Relative: 1 %
HEMATOCRIT: 41 % (ref 35.0–47.0)
HEMOGLOBIN: 13.7 g/dL (ref 12.0–16.0)
LYMPHS ABS: 1 10*3/uL (ref 1.0–3.6)
LYMPHS PCT: 9 %
MCH: 31.9 pg (ref 26.0–34.0)
MCHC: 33.3 g/dL (ref 32.0–36.0)
MCV: 95.8 fL (ref 80.0–100.0)
MONOS PCT: 6 %
Monocytes Absolute: 0.7 10*3/uL (ref 0.2–0.9)
NEUTROS ABS: 9.7 10*3/uL — AB (ref 1.4–6.5)
NEUTROS PCT: 83 %
Platelets: 228 10*3/uL (ref 150–440)
RBC: 4.28 MIL/uL (ref 3.80–5.20)
RDW: 14.5 % (ref 11.5–14.5)
WBC: 11.7 10*3/uL — ABNORMAL HIGH (ref 3.6–11.0)

## 2016-12-27 LAB — BASIC METABOLIC PANEL
Anion gap: 8 (ref 5–15)
BUN: 13 mg/dL (ref 6–20)
CHLORIDE: 108 mmol/L (ref 101–111)
CO2: 25 mmol/L (ref 22–32)
Calcium: 8.7 mg/dL — ABNORMAL LOW (ref 8.9–10.3)
Creatinine, Ser: 0.64 mg/dL (ref 0.44–1.00)
GFR calc non Af Amer: >60 mL/min (ref >60)
GLUCOSE: 108 mg/dL — AB (ref 65–99)
Potassium: 4.2 mmol/L (ref 3.5–5.1)
Sodium: 141 mmol/L (ref 135–145)

## 2016-12-27 LAB — GLUCOSE, CAPILLARY
GLUCOSE-CAPILLARY: 42 mg/dL — AB (ref 65–99)
GLUCOSE-CAPILLARY: 83 mg/dL (ref 65–99)
Glucose-Capillary: 152 mg/dL — ABNORMAL HIGH (ref 65–99)
Glucose-Capillary: 66 mg/dL (ref 65–99)

## 2016-12-27 LAB — INFLUENZA PANEL BY PCR (TYPE A & B)
Influenza A By PCR: NEGATIVE
Influenza B By PCR: NEGATIVE

## 2016-12-27 LAB — TROPONIN I: Troponin I: <0.03 ng/mL (ref <0.03)

## 2016-12-27 LAB — BRAIN NATRIURETIC PEPTIDE: B NATRIURETIC PEPTIDE 5: 546 pg/mL — AB (ref 0.0–100.0)

## 2016-12-27 MED ORDER — FUROSEMIDE 10 MG/ML IJ SOLN
60.0000 mg | Freq: Two times a day (BID) | INTRAMUSCULAR | Status: DC
  Administered 2016-12-27 – 2016-12-29 (×4): 60 mg via INTRAVENOUS
  Filled 2016-12-27 (×4): qty 6

## 2016-12-27 MED ORDER — ACETAMINOPHEN 325 MG PO TABS
650.0000 mg | ORAL_TABLET | Freq: Four times a day (QID) | ORAL | Status: DC | PRN
Start: 2016-12-27 — End: 2016-12-31

## 2016-12-27 MED ORDER — POTASSIUM CHLORIDE CRYS ER 20 MEQ PO TBCR
40.0000 meq | EXTENDED_RELEASE_TABLET | Freq: Once | ORAL | Status: AC
  Administered 2016-12-27: 40 meq via ORAL
  Filled 2016-12-27: qty 2

## 2016-12-27 MED ORDER — IOPAMIDOL (ISOVUE-370) INJECTION 76%
75.0000 mL | Freq: Once | INTRAVENOUS | Status: AC | PRN
  Administered 2016-12-27: 75 mL via INTRAVENOUS

## 2016-12-27 MED ORDER — ACETAMINOPHEN 650 MG RE SUPP: 650.0000 mg | Freq: Four times a day (QID) | RECTAL | Status: DC | PRN

## 2016-12-27 MED ORDER — FUROSEMIDE 10 MG/ML IJ SOLN
40.0000 mg | Freq: Once | INTRAMUSCULAR | Status: AC
  Administered 2016-12-27: 40 mg via INTRAVENOUS
  Filled 2016-12-27: qty 4

## 2016-12-27 MED ORDER — VITAMIN D 1000 UNITS PO TABS
5000.0000 [IU] | ORAL_TABLET | Freq: Every day | ORAL | Status: DC
  Administered 2016-12-27 – 2016-12-31 (×5): 5000 [IU] via ORAL
  Filled 2016-12-27 (×5): qty 5

## 2016-12-27 MED ORDER — INSULIN ASPART 100 UNIT/ML ~~LOC~~ SOLN: 0.0000 [IU] | Freq: Every day | SUBCUTANEOUS | Status: DC

## 2016-12-27 MED ORDER — CARVEDILOL 25 MG PO TABS
25.0000 mg | ORAL_TABLET | Freq: Two times a day (BID) | ORAL | Status: DC
  Administered 2016-12-27 – 2016-12-28 (×3): 25 mg via ORAL
  Filled 2016-12-27 (×3): qty 1

## 2016-12-27 MED ORDER — VITAMIN B-12 100 MCG PO TABS
500.0000 ug | ORAL_TABLET | Freq: Every day | ORAL | Status: DC
  Administered 2016-12-27 – 2016-12-31 (×5): 500 ug via ORAL
  Filled 2016-12-27 (×5): qty 5

## 2016-12-27 MED ORDER — ENOXAPARIN SODIUM 40 MG/0.4ML ~~LOC~~ SOLN
40.0000 mg | SUBCUTANEOUS | Status: DC
  Administered 2016-12-27 – 2016-12-31 (×5): 40 mg via SUBCUTANEOUS
  Filled 2016-12-27 (×5): qty 0.4

## 2016-12-27 MED ORDER — INSULIN ASPART PROT & ASPART (70-30 MIX) 100 UNIT/ML ~~LOC~~ SUSP
30.0000 [IU] | Freq: Every day | SUBCUTANEOUS | Status: DC
  Administered 2016-12-27: 30 [IU] via SUBCUTANEOUS
  Filled 2016-12-27: qty 30

## 2016-12-27 MED ORDER — INSULIN ASPART PROT & ASPART (70-30 MIX) 100 UNIT/ML ~~LOC~~ SUSP
15.0000 [IU] | Freq: Every day | SUBCUTANEOUS | Status: DC
  Administered 2016-12-28: 15 [IU] via SUBCUTANEOUS
  Filled 2016-12-27: qty 15

## 2016-12-27 MED ORDER — SODIUM CHLORIDE 0.9% FLUSH
3.0000 mL | Freq: Two times a day (BID) | INTRAVENOUS | Status: DC
  Administered 2016-12-27 – 2016-12-31 (×9): 3 mL via INTRAVENOUS

## 2016-12-27 MED ORDER — INSULIN ASPART 100 UNIT/ML ~~LOC~~ SOLN
0.0000 [IU] | Freq: Three times a day (TID) | SUBCUTANEOUS | Status: DC
  Administered 2016-12-27 – 2016-12-28 (×2): 3 [IU] via SUBCUTANEOUS
  Administered 2016-12-29 (×2): 2 [IU] via SUBCUTANEOUS
  Administered 2016-12-29 – 2016-12-30 (×2): 3 [IU] via SUBCUTANEOUS
  Administered 2016-12-31: 2 [IU] via SUBCUTANEOUS
  Filled 2016-12-27: qty 3
  Filled 2016-12-27 (×3): qty 2
  Filled 2016-12-27 (×3): qty 3

## 2016-12-27 MED ORDER — POLYETHYLENE GLYCOL 3350 17 G PO PACK
17.0000 g | PACK | Freq: Every day | ORAL | Status: DC | PRN
Start: 2016-12-27 — End: 2016-12-31

## 2016-12-27 MED ORDER — LISINOPRIL 20 MG PO TABS
40.0000 mg | ORAL_TABLET | ORAL | Status: DC
  Administered 2016-12-28 – 2016-12-30 (×3): 40 mg via ORAL
  Filled 2016-12-27 (×3): qty 2

## 2016-12-27 MED ORDER — HYDROCODONE-ACETAMINOPHEN 5-325 MG PO TABS: 1.0000 | ORAL_TABLET | Freq: Four times a day (QID) | ORAL | Status: DC | PRN

## 2016-12-27 MED ORDER — NAPROXEN SODIUM 275 MG PO TABS
275.0000 mg | ORAL_TABLET | Freq: Two times a day (BID) | ORAL | Status: DC
  Filled 2016-12-27: qty 1

## 2016-12-27 MED ORDER — ALBUTEROL SULFATE (2.5 MG/3ML) 0.083% IN NEBU: 2.5000 mg | INHALATION_SOLUTION | RESPIRATORY_TRACT | Status: DC | PRN

## 2016-12-27 MED ORDER — NAPROXEN 500 MG PO TABS
250.0000 mg | ORAL_TABLET | Freq: Two times a day (BID) | ORAL | Status: DC
  Administered 2016-12-28 – 2016-12-31 (×7): 250 mg via ORAL
  Filled 2016-12-27 (×7): qty 1

## 2016-12-27 MED ORDER — ATORVASTATIN CALCIUM 20 MG PO TABS
20.0000 mg | ORAL_TABLET | Freq: Every day | ORAL | Status: DC
  Administered 2016-12-27 – 2016-12-30 (×4): 20 mg via ORAL
  Filled 2016-12-27 (×5): qty 1

## 2016-12-27 MED ORDER — LETROZOLE 2.5 MG PO TABS
2.5000 mg | ORAL_TABLET | Freq: Every day | ORAL | Status: DC
  Administered 2016-12-28 – 2016-12-31 (×4): 2.5 mg via ORAL
  Filled 2016-12-27 (×4): qty 1

## 2016-12-27 MED ORDER — PANTOPRAZOLE SODIUM 40 MG PO TBEC
40.0000 mg | DELAYED_RELEASE_TABLET | Freq: Every day | ORAL | Status: DC
  Administered 2016-12-28 – 2016-12-31 (×4): 40 mg via ORAL
  Filled 2016-12-27 (×4): qty 1

## 2016-12-27 MED ORDER — ONDANSETRON HCL 4 MG/2ML IJ SOLN: 4.0000 mg | Freq: Four times a day (QID) | INTRAMUSCULAR | Status: DC | PRN

## 2016-12-27 MED ORDER — DILTIAZEM HCL 25 MG/5ML IV SOLN
5.0000 mg | Freq: Once | INTRAVENOUS | Status: AC
  Administered 2016-12-28: 5 mg via INTRAVENOUS
  Filled 2016-12-27: qty 5

## 2016-12-27 MED ORDER — INSULIN ASPART PROT & ASPART (70-30 MIX) 100 UNIT/ML ~~LOC~~ SUSP: 15.0000 [IU] | Freq: Two times a day (BID) | SUBCUTANEOUS | Status: DC

## 2016-12-27 MED ORDER — ASPIRIN EC 81 MG PO TBEC
81.0000 mg | DELAYED_RELEASE_TABLET | Freq: Every day | ORAL | Status: DC
  Administered 2016-12-28 – 2016-12-31 (×4): 81 mg via ORAL
  Filled 2016-12-27 (×4): qty 1

## 2016-12-27 MED ORDER — ONDANSETRON HCL 4 MG PO TABS: 4.0000 mg | ORAL_TABLET | Freq: Four times a day (QID) | ORAL | Status: DC | PRN

## 2016-12-27 MED ORDER — TRAZODONE HCL 50 MG PO TABS
150.0000 mg | ORAL_TABLET | Freq: Every day | ORAL | Status: DC
  Administered 2016-12-27 – 2016-12-30 (×4): 150 mg via ORAL
  Filled 2016-12-27 (×4): qty 1

## 2016-12-27 NOTE — ED Notes (Signed)
REDS VEST READING= 46 CHEST RULER= 10.5  VEST FITTING TASKS: POSTURE= Sitting HEIGHT MARKER= T CENTER STRIP= Aligned  COMMENTS:approved by [employer]

## 2016-12-27 NOTE — Consult Note (Signed)
[facility]  CARDIOLOGY CONSULT NOTE  Patient ID: Robin Huff MRN: [id number] DOB/AGE: 05-Mar-1946 71 y.o.  Admit date: 12/27/2016 Referring Physician Dr. Darvin Neighbours Primary Physician   Primary Cardiologist   Reason for Consultation chf  HPI: Patient is a 71 year old female female with no previous heart history other than an evaluation per her report, 6 or 7 years ago where she had palpitations. Workup per her report at that time showed that she had a "normal heart". She has had no further problems. Over the last several days as noted increasing shortness of breath. She presented to the emergency room where evaluation revealed EKG showing normal sinus rhythm with no ischemia. Chest x-ray showed hyperinflation with no definite superimposed infiltrate or pulmonary edema with trace bilateral effusions. Chest CT revealed small pleural effusions with possible edema but no florid pulmonary edema. She was placed on oxygen and felt somewhat better. Her pulse oximetry in the ER was 86% on room air. The natruretic peptide was 546. Troponin was normal. Influenza panel was normal. Any chest pain. She denies eating a low-sodium diet. Any peripheral edema and is unaware of any weight gain.    Review of Systems  HENT: Negative.   Eyes: Negative.   Respiratory: Positive for shortness of breath.   Cardiovascular: Negative.   Gastrointestinal: Negative.   Genitourinary: Negative.   Musculoskeletal: Negative.   Skin: Negative.   Endo/Heme/Allergies: Negative.     Past Medical History:  Diagnosis Date  . Abnormal finding on mammography 11/21/2012   Overview:  Biopsied 11/25/2012 by Dr. Pat Patrick; was due for screening mammogram February 2015.   Marland Kitchen Acid reflux 03/31/2014  . Anemia    h/o  . Apnea, sleep 03/31/2014   Overview:  on CPAP   . B12 deficiency 04/01/2014  . BP (high blood pressure) 03/31/2014  . Breast cancer ([place])   . Cancer (HCC)    BREAST  . Cholelithiasis 05/27/2015   Requiring Cholecystectomy 08/14/2014- Dr. Pat Patrick   . Chronic LBP 05/02/2015  . Clinical depression 03/31/2014   Overview:  S/P suicide attempt 02/1995   . COPD (chronic obstructive pulmonary disease) ([place])    per cxr on 07-10-15  . Diabetes mellitus, type 2 ([place]) 03/31/2014  . Dysrhythmia    h/o fluttering occ  . HLD (hyperlipidemia) 03/31/2014  . Insomnia, persistent 04/01/2014  . Obesity 05/27/2015  . Personal history of radiation therapy   . 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 Hypertension    . Stroke    . Diabetes    . Heart disease    . Hypertension Mother   . Diabetes Brother   . Breast cancer Neg Hx     Social History   Social History  . Marital status: Divorced    Spouse name: N/A  . Number of children: N/A  . Years of education: N/A   Occupational History  . Not on file.   Social History Main Topics  . Smoking status: Former Smoker    Packs/day: 1.00    Years: 40.00    Types: Cigarettes    Quit date: 09/07/2015  . Smokeless tobacco: Never Used  . Alcohol use No  . Drug use: No  . Sexual activity: Not on file   Other Topics Concern  . Not on file   Social History Narrative  . No narrative on file    Past Surgical History:  Procedure Laterality Date  . ABDOMINAL  HYSTERECTOMY     Total  . BACK SURGERY     Multiple  . BREAST BIOPSY Right 06/20/09   neg  . BREAST BIOPSY Left 12/11/12   neg  . BREAST BIOPSY Left 06/04/2015  . BREAST LUMPECTOMY WITH SENTINEL LYMPH NODE BIOPSY Left 07/18/2015   Procedure: BREAST LUMPECTOMY WITH SENTINEL LYMPH NODE BX;  Surgeon: Hubbard Robinson, MD;  Location: [facility] ORS;  Service: General;  Laterality: Left;  . CARPAL TUNNEL RELEASE Right   . DEBRIDEMENT AND CLOSURE WOUND Left 10/01/2015   Procedure: DEBRIDEMENT AND CLOSURE WOUND;  Surgeon: Clayburn Pert, MD;  Location: [facility] ORS;  Service: General;  Laterality: Left;  . KNEE SURGERY Left   . LAPAROSCOPIC CHOLECYSTECTOMY  08/14/2010    Dr. Pat Patrick  . NECK SURGERY     Multiple  . SENTINEL NODE BIOPSY Left 07/18/2015   Procedure: SENTINEL NODE BIOPSY/NEEDLE LOC;  Surgeon: Hubbard Robinson, MD;  Location: [facility] ORS;  Service: General;  Laterality: Left;  . ULNAR NERVE REPAIR       Prescriptions Prior to Admission  Medication Sig Dispense Refill Last Dose  . amLODipine (NORVASC) 10 MG tablet Take 1 tablet by mouth every morning.   0 12/27/2016 at am  . aspirin EC 81 MG tablet Take 81 mg by mouth daily.   12/27/2016 at am  . atorvastatin (LIPITOR) 20 MG tablet Take 1 tablet by mouth at bedtime.   12/27/2016 at am  . CALCIUM PO Take 1 tablet by mouth daily.   12/26/2016 at Unknown time  . carvedilol (COREG) 25 MG tablet Take 1 tablet by mouth 2 (two) times daily.   12/27/2016 at am  . cetirizine (ZYRTEC) 10 MG tablet Take 1 tablet by mouth daily as needed.   prn at prn   . Cholecalciferol (D-5000) 5000 UNITS TABS Take 1 tablet by mouth daily.   12/26/2016 at Unknown time  . cyanocobalamin (V-R VITAMIN B-12) 500 MCG tablet Take 1 tablet by mouth daily.   12/26/2016 at Unknown time  . insulin NPH-regular Human (NOVOLIN 70/30) (70-30) 100 UNIT/ML injection Inject 20-40 Units into the skin 2 (two) times daily. 20 units in am and up to 40 units qhs depending on her bs   12/27/2016 at am  . letrozole (FEMARA) 2.5 MG tablet Take 1 tablet (2.5 mg total) by mouth daily. 30 tablet 11 12/27/2016 at am  . lisinopril (PRINIVIL,ZESTRIL) 40 MG tablet Take 1 tablet by mouth every morning.   0 12/27/2016 at am  . Multiple Vitamin (MULTIVITAMIN) capsule Take 1 capsule by mouth daily.   12/26/2016  . naproxen sodium (RA NAPROXEN SODIUM) 220 MG tablet Take 1 tablet by mouth 2 (two) times daily.   12/27/2016 at am  . omeprazole (PRILOSEC) 20 MG capsule Take 1 capsule by mouth every morning.    12/27/2016 at am  . traZODone (DESYREL) 150 MG tablet Take 1 tablet by mouth at bedtime.   12/26/2016 at qhs  . HYDROcodone-acetaminophen (NORCO/VICODIN) 5-325 MG tablet TK 1 T PO  Q 6 H  PRF MODERATE PAIN  0 Not Taking at Unknown time    Physical Exam: Blood pressure 126/67, pulse 60, temperature 98.1 F (36.7 C), temperature source Oral, resp. rate 16, height 5' 6.25" (1.683 m), weight 101.6 kg (224 lb), SpO2 94 %.   Wt Readings from Last 1 Encounters:  12/27/16 101.6 kg (224 lb)     General appearance: alert and cooperative Head: Normocephalic, without obvious abnormality, atraumatic Resp: clear to auscultation bilaterally  Chest wall: no tenderness Cardio: regular rate and rhythm Extremities: edema Trace to mild peripheral edema Neurologic: Grossly normal  Labs:   Lab Results  Component Value Date   WBC 11.7 (H) 12/27/2016   HGB 13.7 12/27/2016   HCT 41.0 12/27/2016   MCV 95.8 12/27/2016   PLT 228 12/27/2016    Recent Labs Lab 12/27/16 0912  NA 141  K 4.2  CL 108  CO2 25  BUN 13  CREATININE 0.64  CALCIUM 8.7*  GLUCOSE 108*   Lab Results  Component Value Date   TROPONINI <0.03 12/27/2016       ASSESSMENT AND PLAN: 71 year old with history of palpitations but no previous cardiac history who presented with increasing shortness of breath. Chest x-ray and chest CT showed possible mild pulmonary edema. Her pulse oximetry was 86% on room air. She is currently somewhat better on oxygen. She does not eat a low-sodium diet. Will carefully diuresis following renal function, electrolytes and symptoms. We'll also proceed with an echocardiogram to evaluate LV function and make further recognitions after this is complete. Low-sodium diet is stressed.   signed: Teodoro Spray MD, [facility] 12/27/2016, 4:35 PM

## 2016-12-27 NOTE — ED Triage Notes (Signed)
Patient presents to the ED with shortness of breath x 1 week that was worse today.  Per EMS patient's oxygen level was 86% when the fire department got to the home and placed patient on 2L oxygen.  Patient reports increased shortness of breath with exertion, increasing blood pressure over the past few months, and noting some extra swelling to her ankles.  Patient also reports eating a large salty dinner yesterday.  Patient is alert and oriented x 4 and denies history of copd although patient was a smoker until 1 year ago.

## 2016-12-27 NOTE — ED Provider Notes (Signed)
[facility] Emergency Department Provider Note       Time seen: ----------------------------------------- 8:43 AM on 12/27/2016 -----------------------------------------     I have reviewed the triage vital signs and the nursing notes.   HISTORY   Chief Complaint No chief complaint on file.    HPI Robin Huff is a 71 y.o. female who presents to the ED for shortness of breath that has been worsening over the last 7 days. Patient denies a history of this before, nothing makes it better and exertion seems to make it worse. Patient does have difficulty breathing when she lays flat, has noted some peripheral edema. She denies fevers or chills, has had mild productive cough in the mornings. Denies flu symptoms or other complaints.   Past Medical History:  Diagnosis Date  . Abnormal finding on mammography 11/21/2012   Overview:  Biopsied 11/25/2012 by Dr. Pat Patrick; was due for screening mammogram February 2015.   Marland Kitchen Acid reflux 03/31/2014  . Anemia    h/o  . Apnea, sleep 03/31/2014   Overview:  on CPAP   . B12 deficiency 04/01/2014  . BP (high blood pressure) 03/31/2014  . Breast cancer ([place])   . Cancer (HCC)    BREAST  . Cholelithiasis 05/27/2015   Requiring Cholecystectomy 08/14/2014- Dr. Pat Patrick   . Chronic LBP 05/02/2015  . Clinical depression 03/31/2014   Overview:  S/P suicide attempt 02/1995   . COPD (chronic obstructive pulmonary disease) ([place])    per cxr on 07-10-15  . Diabetes mellitus, type 2 ([place]) 03/31/2014  . Dysrhythmia    h/o fluttering occ  . HLD (hyperlipidemia) 03/31/2014  . Insomnia, persistent 04/01/2014  . Obesity 05/27/2015  . Personal history of radiation therapy   . 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 History of breast cancer   . Arthralgia of hip 09/25/2015  . Leg weakness 09/25/2015  . Smoking addiction 08/27/2015  . Malignant neoplasm of upper-inner quadrant of left female breast ([place])   .  Obesity 05/27/2015  . 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 Cholelithiasis 05/27/2015  . Polypharmacy 05/02/2015  . B12 deficiency 04/01/2014  . Insomnia, persistent 04/01/2014  . Clinical depression 03/31/2014  . Diabetes mellitus, type 2 ([place]) 03/31/2014  . Acid reflux 03/31/2014  . HLD (hyperlipidemia) 03/31/2014  . BP (high blood pressure) 03/31/2014  . Elevated WBC count 03/31/2014  . Apnea, sleep 03/31/2014    Past Surgical History:  Procedure Laterality Date  . ABDOMINAL HYSTERECTOMY     Total  . BACK SURGERY     Multiple  . BREAST BIOPSY Right 06/20/09   neg  . BREAST BIOPSY Left 12/11/12   neg  . BREAST BIOPSY Left 06/04/2015  . BREAST LUMPECTOMY WITH SENTINEL LYMPH NODE BIOPSY Left 07/18/2015   Procedure: BREAST LUMPECTOMY WITH SENTINEL LYMPH NODE BX;  Surgeon: Hubbard Robinson, MD;  Location: [facility] ORS;  Service: General;  Laterality: Left;  . CARPAL TUNNEL RELEASE Right   . DEBRIDEMENT AND CLOSURE WOUND Left 10/01/2015   Procedure: DEBRIDEMENT AND CLOSURE WOUND;  Surgeon: Clayburn Pert, MD;  Location: [facility] ORS;  Service: General;  Laterality: Left;  . KNEE SURGERY Left   . LAPAROSCOPIC CHOLECYSTECTOMY  08/14/2010   Dr. Pat Patrick  . NECK SURGERY     Multiple  . SENTINEL NODE BIOPSY Left 07/18/2015   Procedure: SENTINEL NODE BIOPSY/NEEDLE LOC;  Surgeon: Hubbard Robinson, MD;  Location: [facility] ORS;  Service: General;  Laterality: Left;  . ULNAR NERVE REPAIR      Allergies Chlorthalidone; Ciprofloxacin; Codeine; Diclofenac; Hydrochlorothiazide; Adhesive [tape]; and Betadine [povidone iodine]  Social History Social History  Substance Use Topics  . Smoking status: Former Smoker    Packs/day: 1.00    Years: 40.00    Types: Cigarettes    Quit date: 09/07/2015  . Smokeless tobacco: Never Used  . Alcohol use No    Review of Systems Constitutional: Negative for fever. Cardiovascular: Negative for chest pain. Respiratory: Positive for shortness of  breath Gastrointestinal: Negative for abdominal pain, vomiting and diarrhea. Genitourinary: Negative for dysuria. Musculoskeletal: Positive for leg swelling Skin: Negative for rash. Neurological: Negative for headaches, focal weakness or numbness.  10-point ROS otherwise negative.  ____________________________________________   PHYSICAL EXAM:  VITAL SIGNS: ED Triage Vitals  Enc Vitals Group     BP      Pulse      Resp      Temp      Temp src      SpO2      Weight      Height      Head Circumference      Peak Flow      Pain Score      Pain Loc      Pain Edu?      Excl. in [place]?     Constitutional: Alert and oriented. Mild distress Eyes: Conjunctivae are normal. PERRL. Normal extraocular movements. ENT   Head: Normocephalic and atraumatic.   Nose: No congestion/rhinnorhea.   Mouth/Throat: Mucous membranes are moist.   Neck: No stridor. Cardiovascular: Normal rate, regular rhythm. No murmurs, rubs, or gallops. Respiratory: Normal respiratory effort without tachypnea nor retractions. Scattered bilateral rales are appreciated Gastrointestinal: Soft and nontender. Normal bowel sounds Musculoskeletal: Nontender with normal range of motion in extremities. Very mild edema is noted around the ankles Neurologic:  Normal speech and language. No gross focal neurologic deficits are appreciated.  Skin:  Skin is warm, dry and intact. No rash noted. Psychiatric: Mood and affect are normal. Speech and behavior are normal.  ____________________________________________  EKG: Interpreted by me. Sinus rhythm rate of 74 bpm, normal PR interval, normal QRS size, normal QT.  ____________________________________________  ED COURSE:  Pertinent labs & imaging results that were available during my care of the patient were reviewed by me and considered in my medical decision making (see chart for details). Patient presents for dyspnea, we will assess with labs and imaging as  indicated. Clinical Course as of Dec 27 1212  Mon Dec 27, 2016  1111 Oxygen saturations dropped to 86% off oxygen  [JW]    Clinical Course User Index [JW] Earleen Newport, MD   Procedures ____________________________________________   LABS (pertinent positives/negatives)  Labs Reviewed  CBC WITH DIFFERENTIAL/PLATELET - Abnormal; Notable for the following:       Result Value   WBC 11.7 (*)    Neutro Abs 9.7 (*)    All other components within normal limits  BASIC METABOLIC PANEL - Abnormal; Notable for the following:    Glucose, Bld 108 (*)    Calcium 8.7 (*)    All other components within normal limits  BRAIN NATRIURETIC PEPTIDE - Abnormal; Notable for the following:    B Natriuretic Peptide 546.0 (*)    All other components within normal limits  TROPONIN I  INFLUENZA PANEL BY PCR (TYPE A & B)    RADIOLOGY Images were viewed by me  Chest x-ray  IMPRESSION:  Hyperinflation is noted. Bilateral reticular interstitial prominence and peripheral fibrotic changes with slight worsening from prior exam especially in lower lobes. No definite superimposed infiltrate or pulmonary edema. Trace bilateral pleural effusion.  IMPRESSION: 1. No evidence of acute pulmonary embolism. 2. Moderate thoracic aortic atherosclerosis. 3. Coronary artery calcifications and moderate cardiomegaly. 4. Prominent interstitial markings with bilateral pleural effusions which may indicate interstitial edema. Also, pneumonia cannot be excluded at the right lung base. 5. Suspect enlargement of left adrenal gland, not well visualized on this CT of the chest. Possible incidental adrenal adenoma. ____________________________________________  FINAL ASSESSMENT AND PLAN  Dyspnea, Hypoxia, likely CHF  Plan: Patient's labs and imaging were dictated above. Patient had presented for dyspnea and hypoxia. Off oxygen she desaturates. Findings are most likely consistent with congestive heart failure. We have  started her on IV Lasix, I will discuss with the hospitalist for admission, cardiology consultation and echocardiogram   Earleen Newport, MD   Note: This note was generated in part or whole with voice recognition software. Voice recognition is usually quite accurate but there are transcription errors that can and very often do occur. I apologize for any typographical errors that were not detected and corrected.     Earleen Newport, MD 12/27/16 1215

## 2016-12-27 NOTE — Progress Notes (Signed)
Central telemetry called to let me  Know pt was having some SVT, going up into the 150's, this RN checked on pt, pt was asymptomatic at time, however pt was having a hypoglycemic event, blood sugar had dropped to 42. Central telemetry called back again to tell me that pt had now converted into afib. Rate still uncontrolled, ranging from 100-150. Pt still asymptomatic blood sugar now up to 83. Pt has no cardiac history to date, so this is new. Cardiology has already seen pt. Pt is on Coreg and has lisinipril due in the morning. MD paged, Dr. Manuella Ghazi to put in one time orders to give 5mg  IV cardizem. Will wait for orders and give.

## 2016-12-27 NOTE — H&P (Signed)
[place] [facility] [place] NAME: Robin Huff    MR#:  [id number]  DATE OF BIRTH:  Aug 30, 1946  DATE OF ADMISSION:  12/27/2016  PRIMARY CARE PHYSICIAN: Ezequiel Kayser, MD   REQUESTING/REFERRING PHYSICIAN: Dr. Jimmye Norman  CHIEF COMPLAINT:   Chief Complaint  Patient presents with  . Shortness of Breath    HISTORY OF PRESENT ILLNESS:  Robin Huff  is a 71 y.o. female with a known history of Hypertension, diabetes, arthritis presents to the emergency room complaining of 1 week off shortness of breath. She has noticed orthopnea and shortness of breath on exertion. No chest pain. Has had chronic on and off palpitations. Seen Dr. Nehemiah Massed in the past for palpitations with no arrhythmias found. No recent change in medications or diet or fluid intake. Here in the emergency room patient had x-ray and CT scan of the chest which showed interstitial edema with bilateral small pleural effusions. Elevated BNP. She drops to 88% saturation on room air at rest.  PAST MEDICAL HISTORY:   Past Medical History:  Diagnosis Date  . Abnormal finding on mammography 11/21/2012   Overview:  Biopsied 11/25/2012 by Dr. Pat Patrick; was due for screening mammogram February 2015.   Marland Kitchen Acid reflux 03/31/2014  . Anemia    h/o  . Apnea, sleep 03/31/2014   Overview:  on CPAP   . B12 deficiency 04/01/2014  . BP (high blood pressure) 03/31/2014  . Breast cancer ([place])   . Cancer (HCC)    BREAST  . Cholelithiasis 05/27/2015   Requiring Cholecystectomy 08/14/2014- Dr. Pat Patrick   . Chronic LBP 05/02/2015  . Clinical depression 03/31/2014   Overview:  S/P suicide attempt 02/1995   . COPD (chronic obstructive pulmonary disease) ([place])    per cxr on 07-10-15  . Diabetes mellitus, type 2 ([place]) 03/31/2014  . Dysrhythmia    h/o fluttering occ  . HLD (hyperlipidemia) 03/31/2014  . Insomnia, persistent 04/01/2014  . Obesity 05/27/2015  . Personal history of radiation therapy   . 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 ABDOMINAL HYSTERECTOMY     Total  . BACK SURGERY     Multiple  . BREAST BIOPSY Right 06/20/09   neg  . BREAST BIOPSY Left 12/11/12   neg  . BREAST BIOPSY Left 06/04/2015  . BREAST LUMPECTOMY WITH SENTINEL LYMPH NODE BIOPSY Left 07/18/2015   Procedure: BREAST LUMPECTOMY WITH SENTINEL LYMPH NODE BX;  Surgeon: Hubbard Robinson, MD;  Location: [facility] ORS;  Service: General;  Laterality: Left;  . CARPAL TUNNEL RELEASE Right   . DEBRIDEMENT AND CLOSURE WOUND Left 10/01/2015   Procedure: DEBRIDEMENT AND CLOSURE WOUND;  Surgeon: Clayburn Pert, MD;  Location: [facility] ORS;  Service: General;  Laterality: Left;  . KNEE SURGERY Left   . LAPAROSCOPIC CHOLECYSTECTOMY  08/14/2010   Dr. Pat Patrick  . NECK SURGERY     Multiple  . SENTINEL NODE BIOPSY Left 07/18/2015   Procedure: SENTINEL NODE BIOPSY/NEEDLE LOC;  Surgeon: Hubbard Robinson, MD;  Location: [facility] ORS;  Service: General;  Laterality: Left;  . ULNAR NERVE REPAIR     SOCIAL HISTORY:   Social History  Substance Use Topics  . Smoking status: Former Smoker    Packs/day: 1.00    Years: 40.00    Types: Cigarettes    Quit date: 09/07/2015  . Smokeless tobacco: Never Used  . Alcohol use No   FAMILY HISTORY:  Family History  Problem Relation Age of Onset  . Hypertension    . Stroke    . Diabetes    . Heart disease    . Hypertension Mother   . Diabetes Brother   . Breast cancer Neg Hx    DRUG ALLERGIES:   Allergies  Allergen Reactions  . Chlorthalidone Other (See Comments)  . Ciprofloxacin Swelling    Swelling of hands and feet  . Codeine Nausea Only  . Diclofenac     Other reaction(s): Abdominal Pain  . Hydrochlorothiazide     Other reaction(s): Other (See Comments) Leg cramps  . Adhesive [Tape] Rash    IF LEFT ON FOR LONG PERIODS-PAPER TAPE OK TO USE  . Betadine [Povidone Iodine] Rash   REVIEW OF SYSTEMS:   Review of Systems  Constitutional: Positive  for malaise/fatigue. Negative for chills, fever and weight loss.  HENT: Negative for hearing loss and nosebleeds.   Eyes: Negative for blurred vision, double vision and pain.  Respiratory: Positive for shortness of breath. Negative for cough, hemoptysis, sputum production and wheezing.   Cardiovascular: Positive for orthopnea. Negative for chest pain, palpitations and leg swelling.  Gastrointestinal: Negative for abdominal pain, constipation, diarrhea, nausea and vomiting.  Genitourinary: Negative for dysuria and hematuria.  Musculoskeletal: Negative for back pain, falls and myalgias.  Skin: Negative for rash.  Neurological: Negative for dizziness, tremors, sensory change, speech change, focal weakness, seizures and headaches.  Endo/Heme/Allergies: Does not bruise/bleed easily.  Psychiatric/Behavioral: Negative for depression and memory loss. The patient is not nervous/anxious.     MEDICATIONS AT HOME:   Prior to Admission medications   Medication Sig Start Date End Date Taking? Authorizing Provider  amLODipine (NORVASC) 10 MG tablet Take 1 tablet by mouth every morning.  05/02/15  Yes Historical Provider, MD  aspirin EC 81 MG tablet Take 81 mg by mouth daily.   Yes Historical Provider, MD  atorvastatin (LIPITOR) 20 MG tablet Take 1 tablet by mouth at bedtime. 10/29/14  Yes Historical Provider, MD  CALCIUM PO Take 1 tablet by mouth daily.   Yes Historical Provider, MD  carvedilol (COREG) 25 MG tablet Take 1 tablet by mouth 2 (two) times daily. 10/29/14  Yes Historical Provider, MD  cetirizine (ZYRTEC) 10 MG tablet Take 1 tablet by mouth daily as needed.   Yes Historical Provider, MD  Cholecalciferol (D-5000) 5000 UNITS TABS Take 1 tablet by mouth daily.   Yes Historical Provider, MD  cyanocobalamin (V-R VITAMIN B-12) 500 MCG tablet Take 1 tablet by mouth daily.   Yes Historical Provider, MD  insulin NPH-regular Human (NOVOLIN 70/30) (70-30) 100 UNIT/ML injection Inject 20-40 Units into the skin 2  (two) times daily. 20 units in am and up to 40 units qhs depending on her bs 10/29/14  Yes Historical Provider, MD  letrozole (FEMARA) 2.5 MG tablet Take 1 tablet (2.5 mg total) by mouth daily. 01/01/16  Yes Lloyd Huger, MD  lisinopril (PRINIVIL,ZESTRIL) 40 MG tablet Take 1 tablet by mouth every morning.  05/02/15  Yes Historical Provider, MD  Multiple Vitamin (MULTIVITAMIN) capsule Take 1 capsule by mouth daily.   Yes Historical Provider, MD  naproxen sodium (RA NAPROXEN SODIUM) 220 MG tablet Take 1 tablet by mouth 2 (two) times daily.   Yes Historical Provider, MD  omeprazole (PRILOSEC) 20 MG capsule Take 1 capsule by mouth every morning.  10/29/14  Yes Historical Provider, MD  traZODone (DESYREL) 150 MG tablet Take 1 tablet by mouth at bedtime. 10/29/14  Yes  Historical Provider, MD  HYDROcodone-acetaminophen (NORCO/VICODIN) 5-325 MG tablet TK 1 T PO  Q 6 H PRF MODERATE PAIN 10/01/15   Historical Provider, MD     VITAL SIGNS:  Blood pressure 132/60, pulse 71, temperature 98.1 F (36.7 C), temperature source Oral, resp. rate 16, height 5' 6.25" (1.683 m), weight 101.6 kg (224 lb), SpO2 91 %.  PHYSICAL EXAMINATION:  Physical Exam  GENERAL:  71 y.o.-year-old patient lying in the bed with no acute distress. Morbidly obese EYES: Pupils equal, round, reactive to light and accommodation. No scleral icterus. Extraocular muscles intact.  HEENT: Head atraumatic, normocephalic. Oropharynx and nasopharynx clear. No oropharyngeal erythema, moist oral mucosa  NECK:  Supple, no jugular venous distention. No thyroid enlargement, no tenderness.  LUNGS: Normal breath sounds bilaterally, no wheezing, rales, rhonchi. No use of accessory muscles of respiration.  CARDIOVASCULAR: S1, S2 normal. No murmurs, rubs, or gallops.  ABDOMEN: Soft, nontender, nondistended. Bowel sounds present. No organomegaly or mass.  EXTREMITIES: No  cyanosis, or clubbing. + 2 pedal & radial pulses b/l. 1+ lower extremity edema   NEUROLOGIC: Cranial nerves II through XII are intact. No focal Motor or sensory deficits appreciated b/l PSYCHIATRIC: The patient is alert and oriented x 3. Good affect.  SKIN: No obvious rash, lesion, or ulcer.   LABORATORY PANEL:   CBC  Recent Labs Lab 12/27/16 0912  WBC 11.7*  HGB 13.7  HCT 41.0  PLT 228   ------------------------------------------------------------------------------------------------------------------  Chemistries   Recent Labs Lab 12/27/16 0912  NA 141  K 4.2  CL 108  CO2 25  GLUCOSE 108*  BUN 13  CREATININE 0.64  CALCIUM 8.7*   ------------------------------------------------------------------------------------------------------------------  Cardiac Enzymes  Recent Labs Lab 12/27/16 0912  TROPONINI <0.03   ------------------------------------------------------------------------------------------------------------------  RADIOLOGY:  Dg Chest 2 View  Result Date: 12/27/2016 CLINICAL DATA:  Intermittent shortness of breath for 1 week, COPD EXAM: CHEST  2 VIEW COMPARISON:  07/10/2015 FINDINGS: Cardiomediastinal silhouette is stable. Hyperinflation is noted. Bilateral reticular interstitial prominence and peripheral fibrotic changes with slight worsening from prior exam especially in lower lobes. No definite superimposed infiltrate or pulmonary edema. Trace bilateral pleural effusion. Osteopenia and degenerative changes thoracic spine. Stable compression deformity lower thoracic spine. IMPRESSION: Hyperinflation is noted. Bilateral reticular interstitial prominence and peripheral fibrotic changes with slight worsening from prior exam especially in lower lobes. No definite superimposed infiltrate or pulmonary edema. Trace bilateral pleural effusion. Electronically Signed   By: Lahoma Crocker M.D.   On: 12/27/2016 09:12   Ct Angio Chest Pe W And/or Wo Contrast  Result Date: 12/27/2016 CLINICAL DATA:  Increase shortness of breath with exertion, elevated  blood pressure over the last several months, some swelling of the ankles EXAM: CT ANGIOGRAPHY CHEST WITH CONTRAST TECHNIQUE: Multidetector CT imaging of the chest was performed using the standard protocol during bolus administration of intravenous contrast. Multiplanar CT image reconstructions and MIPs were obtained to evaluate the vascular anatomy. CONTRAST:  75 cc Isovue 370 COMPARISON:  Chest x-ray of 12/27/2016 FINDINGS: Cardiovascular: The pulmonary arteries are well opacified. There is no evidence of acute pulmonary embolism. There is partial opacification of the thoracic aorta with no acute abnormality noted. Moderate thoracic aortic atherosclerosis is present. The mid ascending thoracic aorta measures 30 mm in diameter. Coronary artery calcifications are noted. Moderate cardiomegaly is present. Mediastinum/Nodes: No mediastinal or hilar adenopathy is seen with only small lymph nodes present. Lungs/Pleura: Chronic interstitial changes are present anteriorly within the upper lobes. There are small to moderate size bilateral pleural  effusions present right slightly larger than left. Opacification of a portion of the right lower lobe may be due to atelectasis, but pneumonia cannot be excluded. There are prominent interstitial markings at the lung bases is well and interstitial edema is a definite consideration. Upper Abdomen: On the few images through the upper abdomen there is suggestion of left adrenal enlargement which could be due to adenoma but cannot be assessed well on this CT of the chest. Musculoskeletal: There are degenerative changes in the mid to lower thoracic spine. Review of the MIP images confirms the above findings. IMPRESSION: 1. No evidence of acute pulmonary embolism. 2. Moderate thoracic aortic atherosclerosis. 3. Coronary artery calcifications and moderate cardiomegaly. 4. Prominent interstitial markings with bilateral pleural effusions which may indicate interstitial edema. Also, pneumonia  cannot be excluded at the right lung base. 5. Suspect enlargement of left adrenal gland, not well visualized on this CT of the chest. Possible incidental adrenal adenoma. Electronically Signed   By: Ivar Drape M.D.   On: 12/27/2016 11:36     IMPRESSION AND PLAN:   * Acute congestive heart failure. New-onset. Unknown ejection fraction. - IV Lasix, Beta blockers - Input and Output - Counseled to limit fluids and Salt - Monitor Bun/Cr and Potassium - Echo -Cardiology consult . Has seen Dr. Nehemiah Massed in the past.  * Palpitations Had workup in the past and no arrhythmias were found. Monitor on telemetry.  * Hypertension. Continue lisinopril and Coreg. Hold Norvasc.  * Diabetes mellitus. Continue home dose of insulin 70/30. Reduce dose in the hospital. Sliding scale insulin.  * Acute hypoxic respiratory failure due to CHF. Wean oxygen as tolerated. Nebs when necessary.  * DVT prophylaxis with Lovenox  All the records are reviewed and case discussed with ED provider. Management plans discussed with the patient, family and they are in agreement.  CODE STATUS: FULL CODE  TOTAL TIME TAKING CARE OF THIS PATIENT: 40 minutes.   Hillary Bow R M.D on 12/27/2016 at 12:54 PM  Between 7am to 6pm - Pager - [phone]  After 6pm go to [email or web address] - password [email or web address] [facility]  Office  [phone]  CC: Primary care physician; Ezequiel Kayser, MD  Note: This dictation was prepared with Dragon dictation along with smaller phrase technology. Any transcriptional errors that result from this process are unintentional.

## 2016-12-27 NOTE — Progress Notes (Signed)
Hypoglycemic Event  CBG: 42 @ 2101  Treatment: one cranberry juice  Symptoms: sweaty  Follow-up CBG: Time: 2137 CBG Result:66  Pt ate another pack of graham crackers and we rechecked again in an hour. CBG: 83 @ 2237.  Pt states she feels better and this CBG is good for her. Told pt we will check it again in the morning, and if she felt bad again to let me know. Will continue to monitor.    Velna Ochs

## 2016-12-27 NOTE — Progress Notes (Signed)
Pt has home CPAP plugged into red outlet with 2L O2 inline. No signs of damage to home machine.

## 2016-12-27 NOTE — Progress Notes (Addendum)
Patient admitted to the floor from ED for for SOB, new onset   Heart failure   and weakness, patient alert and oriented, denies any pain at this time, VSS. Mood calm, assessment completed, IV lasix tx continue. Living with heart failure education initiated , patient oriented to room and call system.

## 2016-12-28 ENCOUNTER — Inpatient Hospital Stay
Admit: 2016-12-28 | Discharge: 2016-12-28 | Disposition: A | Discharge status: Home / Self Care | Payer: Medicare Other | Attending: Internal Medicine | Admitting: Internal Medicine

## 2016-12-28 LAB — BASIC METABOLIC PANEL
ANION GAP: 5 (ref 5–15)
BUN: 13 mg/dL (ref 6–20)
CALCIUM: 8.6 mg/dL — AB (ref 8.9–10.3)
CO2: 31 mmol/L (ref 22–32)
Chloride: 105 mmol/L (ref 101–111)
Creatinine, Ser: 0.76 mg/dL (ref 0.44–1.00)
Glucose, Bld: 99 mg/dL (ref 65–99)
POTASSIUM: 3.4 mmol/L — AB (ref 3.5–5.1)
SODIUM: 141 mmol/L (ref 135–145)

## 2016-12-28 LAB — ECHOCARDIOGRAM COMPLETE
Height: 66.25 in
Weight: 3502.4 oz

## 2016-12-28 LAB — GLUCOSE, CAPILLARY
GLUCOSE-CAPILLARY: 117 mg/dL — AB (ref 65–99)
Glucose-Capillary: 96 mg/dL (ref 65–99)
Glucose-Capillary: 167 mg/dL — ABNORMAL HIGH (ref 65–99)
Glucose-Capillary: 74 mg/dL (ref 65–99)

## 2016-12-28 LAB — HEMOGLOBIN A1C
Hgb A1c MFr Bld: 6.3 % — ABNORMAL HIGH (ref 4.8–5.6)
Mean Plasma Glucose: 134 mg/dL

## 2016-12-28 LAB — MAGNESIUM: MAGNESIUM: 2 mg/dL (ref 1.7–2.4)

## 2016-12-28 MED ORDER — INSULIN ASPART PROT & ASPART (70-30 MIX) 100 UNIT/ML ~~LOC~~ SUSP
20.0000 [IU] | Freq: Every day | SUBCUTANEOUS | Status: DC
  Administered 2016-12-28 – 2016-12-29 (×2): 20 [IU] via SUBCUTANEOUS
  Administered 2016-12-30: 15 [IU] via SUBCUTANEOUS
  Filled 2016-12-28 (×3): qty 20

## 2016-12-28 MED ORDER — INSULIN ASPART PROT & ASPART (70-30 MIX) 100 UNIT/ML ~~LOC~~ SUSP
10.0000 [IU] | Freq: Every day | SUBCUTANEOUS | Status: DC
  Administered 2016-12-29 – 2016-12-31 (×3): 10 [IU] via SUBCUTANEOUS
  Filled 2016-12-28 (×3): qty 10

## 2016-12-28 MED ORDER — DILTIAZEM HCL 30 MG PO TABS
30.0000 mg | ORAL_TABLET | Freq: Four times a day (QID) | ORAL | Status: DC
  Administered 2016-12-28 – 2016-12-29 (×3): 30 mg via ORAL
  Filled 2016-12-28 (×3): qty 1

## 2016-12-28 MED ORDER — DILTIAZEM HCL 25 MG/5ML IV SOLN
10.0000 mg | Freq: Once | INTRAVENOUS | Status: AC
  Administered 2016-12-28: 10 mg via INTRAVENOUS
  Filled 2016-12-28: qty 5

## 2016-12-28 NOTE — Progress Notes (Signed)
Inpatient Diabetes Program Recommendations  AACE/ADA: New Consensus Statement on Inpatient Glycemic Control (2015)  Target Ranges:  Prepandial:   less than 140 mg/dL      Peak postprandial:   less than 180 mg/dL (1-2 hours)      Critically ill patients:  140 - 180 mg/dL   Lab Results  Component Value Date   GLUCAP 96 12/28/2016   HGBA1C 6.3 (H) 12/27/2016    Review of Glycemic Control  Results for Robin Huff, Robin Huff (MRN [id number]) as of 12/28/2016 11:07  Ref. Range 12/27/2016 17:37 12/27/2016 21:01 12/27/2016 21:37 12/27/2016 22:37 12/28/2016 07:47  Glucose-Capillary Latest Ref Range: 65 - 99 mg/dL 152 (H) 42 (LL) 66 83 96    Diabetes history: Type 2 Outpatient Diabetes medications: Novolin mix 70/30 20 units qam, 40 units qpm Current orders for Inpatient glycemic control: Novolog mix 70/30 15 units with breakfast, 70/30- 30 units pre-supper, Novolog 0-15 units tid, Novolog 0-5 units qhs  Inpatient Diabetes Program Recommendations:  Please consider decreasing am Novolog 70/30 to 10 units qam, evening dose Novolog 70/30 to 20 units.   Continue Novolog correction as ordered.  Gentry Fitz, RN, BA, MHA, CDE Diabetes Coordinator Inpatient Diabetes Program  [phone] (Team Pager) [phone] ([place]) 12/28/2016 11:14 AM

## 2016-12-28 NOTE — Progress Notes (Signed)
*  PRELIMINARY RESULTS* Echocardiogram 2D Echocardiogram has been performed.  Robin Huff 12/28/2016, 10:06 AM

## 2016-12-28 NOTE — Progress Notes (Signed)
[place] at [place] NAME: Robin Huff    MR#:  [id number]  DATE OF BIRTH:  1946-09-14  SUBJECTIVE: Needed for shortness of breath, acute heart failure. Also found to have hypoxia with sats 88% on room air when she came. Today she feels better on IV Lasix.  But heart rate is still elevated 120 bpm. 2 sats 98% on room air today.   CHIEF COMPLAINT:   Chief Complaint  Patient presents with  . Shortness of Breath    REVIEW OF SYSTEMS:   ROS CONSTITUTIONAL: No fever, fatigue or weakness.  EYES: No blurred or double vision.  EARS, NOSE, AND THROAT: No tinnitus or ear pain.  RESPIRATORY: No cough, shortness of breath, wheezing or hemoptysis.  CARDIOVASCULAR: No chest pain, orthopnea, edema.  GASTROINTESTINAL: No nausea, vomiting, diarrhea or abdominal pain.  GENITOURINARY: No dysuria, hematuria.  ENDOCRINE: No polyuria, nocturia,  HEMATOLOGY: No anemia, easy bruising or bleeding SKIN: No rash or lesion. MUSCULOSKELETAL: No joint pain or arthritis.   NEUROLOGIC: No tingling, numbness, weakness.  PSYCHIATRY: No anxiety or depression.   DRUG ALLERGIES:   Allergies  Allergen Reactions  . Chlorthalidone Other (See Comments)  . Ciprofloxacin Swelling    Swelling of hands and feet  . Codeine Nausea Only  . Diclofenac     Other reaction(s): Abdominal Pain  . Hydrochlorothiazide     Other reaction(s): Other (See Comments) Leg cramps  . Adhesive [Tape] Rash    IF LEFT ON FOR LONG PERIODS-PAPER TAPE OK TO USE  . Betadine [Povidone Iodine] Rash    VITALS:  Blood pressure 118/64, pulse 99, temperature 97.9 F (36.6 C), temperature source Oral, resp. rate 14, height 5' 6.25" (1.683 m), weight 99.3 kg (218 lb 14.4 oz), SpO2 98 %.  PHYSICAL EXAMINATION:  GENERAL:  71 y.o.-year-old patient lying in the bed with no acute distress.  EYES: Pupils equal, round, reactive to light and accommodation. No scleral icterus. Extraocular muscles  intact.  HEENT: Head atraumatic, normocephalic. Oropharynx and nasopharynx clear.  NECK:  Supple, no jugular venous distention. No thyroid enlargement, no tenderness.  LUNGS: Normal breath sounds bilaterally, no wheezing, rales,rhonchi or crepitation. No use of accessory muscles of respiration.  CARDIOVASCULAR: S1, S2 tachycardic. No murmurs, rubs, or gallops.  ABDOMEN: Soft, nontender, nondistended. Bowel sounds present. No organomegaly or mass.  EXTREMITIES: No pedal edema, cyanosis, or clubbing.  NEUROLOGIC: Cranial nerves II through XII are intact. Muscle strength 5/5 in all extremities. Sensation intact. Gait not checked.  PSYCHIATRIC: The patient is alert and oriented x 3.  SKIN: No obvious rash, lesion, or ulcer.    LABORATORY PANEL:   CBC  Recent Labs Lab 12/27/16 0912  WBC 11.7*  HGB 13.7  HCT 41.0  PLT 228   ------------------------------------------------------------------------------------------------------------------  Chemistries   Recent Labs Lab 12/28/16 0443  NA 141  K 3.4*  CL 105  CO2 31  GLUCOSE 99  BUN 13  CREATININE 0.76  CALCIUM 8.6*  MG 2.0   ------------------------------------------------------------------------------------------------------------------  Cardiac Enzymes  Recent Labs Lab 12/27/16 0912  TROPONINI <0.03   ------------------------------------------------------------------------------------------------------------------  RADIOLOGY:  Dg Chest 2 View  Result Date: 12/27/2016 CLINICAL DATA:  Intermittent shortness of breath for 1 week, COPD EXAM: CHEST  2 VIEW COMPARISON:  07/10/2015 FINDINGS: Cardiomediastinal silhouette is stable. Hyperinflation is noted. Bilateral reticular interstitial prominence and peripheral fibrotic changes with slight worsening from prior exam especially in lower lobes. No definite superimposed infiltrate or pulmonary edema. Trace bilateral pleural  effusion. Osteopenia and degenerative changes thoracic  spine. Stable compression deformity lower thoracic spine. IMPRESSION: Hyperinflation is noted. Bilateral reticular interstitial prominence and peripheral fibrotic changes with slight worsening from prior exam especially in lower lobes. No definite superimposed infiltrate or pulmonary edema. Trace bilateral pleural effusion. Electronically Signed   By: Lahoma Crocker M.D.   On: 12/27/2016 09:12   Ct Angio Chest Pe W And/or Wo Contrast  Result Date: 12/27/2016 CLINICAL DATA:  Increase shortness of breath with exertion, elevated blood pressure over the last several months, some swelling of the ankles EXAM: CT ANGIOGRAPHY CHEST WITH CONTRAST TECHNIQUE: Multidetector CT imaging of the chest was performed using the standard protocol during bolus administration of intravenous contrast. Multiplanar CT image reconstructions and MIPs were obtained to evaluate the vascular anatomy. CONTRAST:  75 cc Isovue 370 COMPARISON:  Chest x-ray of 12/27/2016 FINDINGS: Cardiovascular: The pulmonary arteries are well opacified. There is no evidence of acute pulmonary embolism. There is partial opacification of the thoracic aorta with no acute abnormality noted. Moderate thoracic aortic atherosclerosis is present. The mid ascending thoracic aorta measures 30 mm in diameter. Coronary artery calcifications are noted. Moderate cardiomegaly is present. Mediastinum/Nodes: No mediastinal or hilar adenopathy is seen with only small lymph nodes present. Lungs/Pleura: Chronic interstitial changes are present anteriorly within the upper lobes. There are small to moderate size bilateral pleural effusions present right slightly larger than left. Opacification of a portion of the right lower lobe may be due to atelectasis, but pneumonia cannot be excluded. There are prominent interstitial markings at the lung bases is well and interstitial edema is a definite consideration. Upper Abdomen: On the few images through the upper abdomen there is suggestion of  left adrenal enlargement which could be due to adenoma but cannot be assessed well on this CT of the chest. Musculoskeletal: There are degenerative changes in the mid to lower thoracic spine. Review of the MIP images confirms the above findings. IMPRESSION: 1. No evidence of acute pulmonary embolism. 2. Moderate thoracic aortic atherosclerosis. 3. Coronary artery calcifications and moderate cardiomegaly. 4. Prominent interstitial markings with bilateral pleural effusions which may indicate interstitial edema. Also, pneumonia cannot be excluded at the right lung base. 5. Suspect enlargement of left adrenal gland, not well visualized on this CT of the chest. Possible incidental adrenal adenoma. Electronically Signed   By: Ivar Drape M.D.   On: 12/27/2016 11:36    EKG:   Orders placed or performed during the hospital encounter of 12/27/16  . ED EKG  . ED EKG  . EKG 12-Lead  . EKG 12-Lead    ASSESSMENT AND PLAN:   1 . Diastolic heart failure: EF 65% by echocardiogram. On IV Lasix, symptoms are improved. #2 acute respiratory failure with hypoxia secondary to CHF exacerbation: Not hypoxic anymore, O2 sats are better on room air to 98%, patient on IV Lasix. Change it to by mouth Lasix today. #3. Hypertension: Controlled #4 diabetes mellitus type 2: episode of hypoglycemia last night, borderline sugars this morning, adjust the insulin. 5 .atrial fibrillation: Continue Coreg, add when necessary Cardizem 30 mg q 6hrs if hr  More than 100.  All the records are reviewed and case discussed with Care Management/Social Workerr. Management plans discussed with the patient, family and they are in agreement.  CODE STATUS:full  TOTAL TIME TAKING CARE OF THIS PATIENT: 38minutes.   POSSIBLE D/C IN 1-2DAYS, DEPENDING ON CLINICAL CONDITION.   Epifanio Lesches M.D on 12/28/2016 at 3:16 PM  Between 7am to 6pm -  Pager - [phone]  After 6pm go to [email or web address] - password [email or web address] [place]  [facility]  Office  [phone]  CC: Primary care physician; Ezequiel Kayser, MD   Note: This dictation was prepared with Dragon dictation along with smaller phrase technology. Any transcriptional errors that result from this process are unintentional.

## 2016-12-28 NOTE — Progress Notes (Signed)
Dr. Ferol Luz made aware of elevated HR with excertion

## 2016-12-28 NOTE — Progress Notes (Signed)
Dr. Ferol Luz paged to make aware that HR is still remaining in a.fib  120-140s

## 2016-12-29 LAB — GLUCOSE, CAPILLARY
GLUCOSE-CAPILLARY: 129 mg/dL — AB (ref 65–99)
GLUCOSE-CAPILLARY: 100 mg/dL — AB (ref 65–99)
Glucose-Capillary: 181 mg/dL — ABNORMAL HIGH (ref 65–99)
Glucose-Capillary: 129 mg/dL — ABNORMAL HIGH (ref 65–99)

## 2016-12-29 MED ORDER — FUROSEMIDE 10 MG/ML IJ SOLN
40.0000 mg | Freq: Two times a day (BID) | INTRAMUSCULAR | Status: DC
Start: 2016-12-29 — End: 2016-12-31
  Administered 2016-12-29 – 2016-12-30 (×3): 40 mg via INTRAVENOUS
  Filled 2016-12-29 (×3): qty 4

## 2016-12-29 MED ORDER — METOPROLOL TARTRATE 50 MG PO TABS
50.0000 mg | ORAL_TABLET | Freq: Two times a day (BID) | ORAL | Status: DC
  Administered 2016-12-29 – 2016-12-31 (×5): 50 mg via ORAL
  Filled 2016-12-29 (×5): qty 1

## 2016-12-29 MED ORDER — DILTIAZEM HCL 60 MG PO TABS
60.0000 mg | ORAL_TABLET | Freq: Four times a day (QID) | ORAL | Status: DC
  Administered 2016-12-29 – 2016-12-30 (×2): 60 mg via ORAL
  Filled 2016-12-29 (×2): qty 1

## 2016-12-29 NOTE — Plan of Care (Signed)
Problem: Fluid Volume: Goal: Ability to maintain a balanced intake and output will improve Outcome: Progressing Fluid status reviewed and compliancy voiced.  Continues to diurese.

## 2016-12-29 NOTE — Care Management Important Message (Signed)
Important Message  Patient Details  Name: Robin Huff MRN: [id number] Date of Birth: 04/05/1946   Medicare Important Message Given:  Yes  Signed notice given    Katrina Stack, RN 12/29/2016, 4:13 PM

## 2016-12-29 NOTE — Progress Notes (Signed)
Pt on home CPAP. CPAP plugged into red outlet. No signs of damage.

## 2016-12-29 NOTE — Care Management (Signed)
New onset of congestive heart failure.  HF Clinic referral has been made.  Patient is firm in her statement that her daughter is going to purchase scales.  Independent in all adls, denies issues accessing medical care, obtaining medications or with transportation.  Current with her PCP.

## 2016-12-29 NOTE — Progress Notes (Signed)
[place] at [place] NAME: Robin Huff    MR#:  [id number]  DATE OF BIRTH:  1946-01-23  SUBJECTIVE: Tachycardic with heart rate up to 1 20 bpm. Patient felt fluttering the chest this morning.  coreghas been changed to metoprolol by cardiology..   CHIEF COMPLAINT:   Chief Complaint  Patient presents with  . Shortness of Breath    REVIEW OF SYSTEMS:   ROS CONSTITUTIONAL: No fever, fatigue or weakness.  EYES: No blurred or double vision.  EARS, NOSE, AND THROAT: No tinnitus or ear pain.  RESPIRATORY: No cough, shortness of breath, wheezing or hemoptysis.  CARDIOVASCULAR: No chest pain, orthopnea, edema.  GASTROINTESTINAL: No nausea, vomiting, diarrhea or abdominal pain.  GENITOURINARY: No dysuria, hematuria.  ENDOCRINE: No polyuria, nocturia,  HEMATOLOGY: No anemia, easy bruising or bleeding SKIN: No rash or lesion. MUSCULOSKELETAL: No joint pain or arthritis.   NEUROLOGIC: No tingling, numbness, weakness.  PSYCHIATRY: No anxiety or depression.   DRUG ALLERGIES:   Allergies  Allergen Reactions  . Chlorthalidone Other (See Comments)  . Ciprofloxacin Swelling    Swelling of hands and feet  . Codeine Nausea Only  . Diclofenac     Other reaction(s): Abdominal Pain  . Hydrochlorothiazide     Other reaction(s): Other (See Comments) Leg cramps  . Adhesive [Tape] Rash    IF LEFT ON FOR LONG PERIODS-PAPER TAPE OK TO USE  . Betadine [Povidone Iodine] Rash    VITALS:  Blood pressure 105/82, pulse 85, temperature 98.6 F (37 C), resp. rate 20, height 5' 6.25" (1.683 m), weight 99.1 kg (218 lb 7.6 oz), SpO2 94 %.  PHYSICAL EXAMINATION:  GENERAL:  71 y.o.-year-old patient lying in the bed with no acute distress.  EYES: Pupils equal, round, reactive to light and accommodation. No scleral icterus. Extraocular muscles intact.  HEENT: Head atraumatic, normocephalic. Oropharynx and nasopharynx clear.  NECK:  Supple, no jugular  venous distention. No thyroid enlargement, no tenderness.  LUNGS: Normal breath sounds bilaterally, no wheezing, rales,rhonchi or crepitation. No use of accessory muscles of respiration.  CARDIOVASCULAR: S1, S2 tachycardic. ,irregular.No murmurs, rubs, or gallops.  ABDOMEN: Soft, nontender, nondistended. Bowel sounds present. No organomegaly or mass.  EXTREMITIES: No pedal edema, cyanosis, or clubbing.  NEUROLOGIC: Cranial nerves II through XII are intact. Muscle strength 5/5 in all extremities. Sensation intact. Gait not checked.  PSYCHIATRIC: The patient is alert and oriented x 3.  SKIN: No obvious rash, lesion, or ulcer.    LABORATORY PANEL:   CBC  Recent Labs Lab 12/27/16 0912  WBC 11.7*  HGB 13.7  HCT 41.0  PLT 228   ------------------------------------------------------------------------------------------------------------------  Chemistries   Recent Labs Lab 12/28/16 0443  NA 141  K 3.4*  CL 105  CO2 31  GLUCOSE 99  BUN 13  CREATININE 0.76  CALCIUM 8.6*  MG 2.0   ------------------------------------------------------------------------------------------------------------------  Cardiac Enzymes  Recent Labs Lab 12/27/16 0912  TROPONINI <0.03   ------------------------------------------------------------------------------------------------------------------  RADIOLOGY:  No results found.  EKG:   Orders placed or performed during the hospital encounter of 12/27/16  . ED EKG  . ED EKG  . EKG 12-Lead  . EKG 12-Lead    ASSESSMENT AND PLAN:   1 . Diastolic heart failure: EF 65% by echocardiogram. On  Lasix, symptoms are improved.   #2 acute respiratory failure with hypoxia secondary to CHF exacerbation: Not hypoxic anymore, EF 65%.  #3. Hypertension: Controlled #4 diabetes mellitus type 2:adjusted insulin. 5 .atrial fibrillation: had  rapid A. fib this morning. Coreg is , change it to metoprolol 50 mg twice a day. Monitor on telemetry, CHADS score  5;started on Eliquis. Discussed  risks and benefits of anticoagulation.    likely Discharge tomorrow. Monitor on telemetry for further changes in heart rate and further need for medication adjustment   All the records are reviewed and case discussed with Care Management/Social Workerr. Management plans discussed with the patient, family and they are in agreement.  CODE STATUS:full  TOTAL TIME TAKING CARE OF THIS PATIENT: 101minutes.   POSSIBLE D/C IN 1-2DAYS, DEPENDING ON CLINICAL CONDITION.   Epifanio Lesches M.D on 12/29/2016 at 1:27 PM  Between 7am to 6pm - Pager - [phone]  After 6pm go to [email or web address] - password [email or web address] [place] [facility]  Office  [phone]  CC: Primary care physician; Ezequiel Kayser, MD   Note: This dictation was prepared with Dragon dictation along with smaller phrase technology. Any transcriptional errors that result from this process are unintentional.

## 2016-12-29 NOTE — Progress Notes (Signed)
[facility]  SUBJECTIVE: sob   Vitals:   12/28/16 1820 12/28/16 1836 12/28/16 1950 12/29/16 0402  BP:   (!) 120/59 122/77  Pulse: (!) 142 87 74 100  Resp:   16 16  Temp:   98.2 F (36.8 C) 98.3 F (36.8 C)  TempSrc:   Oral   SpO2:   92% 92%  Weight:    99.1 kg (218 lb 7.6 oz)  Height:        Intake/Output Summary (Last 24 hours) at 12/29/16 0738 Last data filed at 12/29/16 0403  Gross per 24 hour  Intake              480 ml  Output              500 ml  Net              -20 ml    LABS: Basic Metabolic Panel:  Recent Labs  12/27/16 0912 12/28/16 0443  NA 141 141  K 4.2 3.4*  CL 108 105  CO2 25 31  GLUCOSE 108* 99  BUN 13 13  CREATININE 0.64 0.76  CALCIUM 8.7* 8.6*  MG  --  2.0   Liver Function Tests: No results for input(s): AST, ALT, ALKPHOS, BILITOT, PROT, ALBUMIN in the last 72 hours. No results for input(s): LIPASE, AMYLASE in the last 72 hours. CBC:  Recent Labs  12/27/16 0912  WBC 11.7*  NEUTROABS 9.7*  HGB 13.7  HCT 41.0  MCV 95.8  PLT 228   Cardiac Enzymes:  Recent Labs  12/27/16 0912  TROPONINI <0.03   BNP: Invalid input(s): POCBNP D-Dimer: No results for input(s): DDIMER in the last 72 hours. Hemoglobin A1C:  Recent Labs  12/27/16 1455  HGBA1C 6.3*   Fasting Lipid Panel: No results for input(s): CHOL, HDL, LDLCALC, TRIG, CHOLHDL, LDLDIRECT in the last 72 hours. Thyroid Function Tests: No results for input(s): TSH, T4TOTAL, T3FREE, THYROIDAB in the last 72 hours.  Invalid input(s): FREET3 Anemia Panel: No results for input(s): VITAMINB12, FOLATE, FERRITIN, TIBC, IRON, RETICCTPCT in the last 72 hours.   Physical Exam: Blood pressure 122/77, pulse 100, temperature 98.3 F (36.8 C), resp. rate 16, height 5' 6.25" (1.683 m), weight 99.1 kg (218 lb 7.6 oz), SpO2 92 %.   Wt Readings from Last 1 Encounters:  12/29/16 99.1 kg (218 lb 7.6 oz)     General appearance: cooperative Resp:  rhonchi bibasilar Cardio: irregularly irregular rhythm GI: soft, non-tender; bowel sounds normal; no masses,  no organomegaly Extremities: edema 2-3+ Neurologic: Grossly normal  TELEMETRY: Reviewed telemetry pt in afib with variable but intermittantly rapid vr. Rate 124 at present.   ASSESSMENT AND PLAN:  Active Problems:   CHF (congestive heart failure) (HCC)-diastollic with normal lv functin. Careful diuresis and follow  afib-Was in nsr on admission. Developed afib with rate somewhat rapid. Will discontinue carvedilol since lv function normal and change to metoprolol tartrate 50 bid to see if this improves rate control CHADS score of 5. Will need anticoagulation. Will start with Eliquis 5 mg bid.     Teodoro Spray, MD, [facility] 12/29/2016 7:38 AM

## 2016-12-30 LAB — BASIC METABOLIC PANEL
ANION GAP: 7 (ref 5–15)
BUN: 25 mg/dL — AB (ref 6–20)
CHLORIDE: 102 mmol/L (ref 101–111)
CO2: 32 mmol/L (ref 22–32)
CREATININE: 0.87 mg/dL (ref 0.44–1.00)
Calcium: 8.9 mg/dL (ref 8.9–10.3)
GFR calc non Af Amer: >60 mL/min (ref >60)
Glucose, Bld: 96 mg/dL (ref 65–99)
POTASSIUM: 3.7 mmol/L (ref 3.5–5.1)
Sodium: 141 mmol/L (ref 135–145)

## 2016-12-30 LAB — GLUCOSE, CAPILLARY
GLUCOSE-CAPILLARY: 95 mg/dL (ref 65–99)
Glucose-Capillary: 107 mg/dL — ABNORMAL HIGH (ref 65–99)
Glucose-Capillary: 162 mg/dL — ABNORMAL HIGH (ref 65–99)
Glucose-Capillary: 84 mg/dL (ref 65–99)

## 2016-12-30 MED ORDER — DILTIAZEM HCL 30 MG PO TABS
30.0000 mg | ORAL_TABLET | Freq: Three times a day (TID) | ORAL | Status: DC
  Administered 2016-12-30 – 2016-12-31 (×3): 30 mg via ORAL
  Filled 2016-12-30 (×3): qty 1

## 2016-12-30 NOTE — Care Management (Signed)
Barrier to discharge- heart rate. Discussed mobilization to prevent decline in functional status during progression. Provided patient with Eliquis coupon

## 2016-12-30 NOTE — Plan of Care (Signed)
Problem: Cardiac: Goal: Ability to achieve and maintain adequate cardiopulmonary perfusion will improve Outcome: Not Progressing Notified of 3 sec pause on telemetry this shift.  Pt asleep with CPAP intact.. Afib noted on telemetry.  Will hold cardizem til seem by MD this morning.

## 2016-12-30 NOTE — Progress Notes (Signed)
[place] at [place] NAME: Robin Huff    MR#:  [id number]  DATE OF BIRTH:  [birth year]    CHIEF COMPLAINT:   Chief Complaint  Patient presents with  . Shortness of Breath   Feels better overall Afib with RVR on activity 3 second pause overnight  REVIEW OF SYSTEMS:   ROS CONSTITUTIONAL: No fever, fatigue or weakness.  EYES: No blurred or double vision.  EARS, NOSE, AND THROAT: No tinnitus or ear pain.  RESPIRATORY: No cough, shortness of breath, wheezing or hemoptysis.  CARDIOVASCULAR: No chest pain, orthopnea, edema.  GASTROINTESTINAL: No nausea, vomiting, diarrhea or abdominal pain.  GENITOURINARY: No dysuria, hematuria.  ENDOCRINE: No polyuria, nocturia,  HEMATOLOGY: No anemia, easy bruising or bleeding SKIN: No rash or lesion. MUSCULOSKELETAL: No joint pain or arthritis.   NEUROLOGIC: No tingling, numbness, weakness.  PSYCHIATRY: No anxiety or depression.   DRUG ALLERGIES:   Allergies  Allergen Reactions  . Chlorthalidone Other (See Comments)  . Ciprofloxacin Swelling    Swelling of hands and feet  . Codeine Nausea Only  . Diclofenac     Other reaction(s): Abdominal Pain  . Hydrochlorothiazide     Other reaction(s): Other (See Comments) Leg cramps  . Adhesive [Tape] Rash    IF LEFT ON FOR LONG PERIODS-PAPER TAPE OK TO USE  . Betadine [Povidone Iodine] Rash    VITALS:  Blood pressure 122/85, pulse (!) 110, temperature 97.8 F (36.6 C), temperature source Oral, resp. rate 16, height 5' 6.25" (1.683 m), weight 98.6 kg (217 lb 6 oz), SpO2 93 %.  PHYSICAL EXAMINATION:  GENERAL:  71 y.o.-year-old patient lying in the bed with no acute distress.  EYES: Pupils equal, round, reactive to light and accommodation. No scleral icterus. Extraocular muscles intact.  HEENT: Head atraumatic, normocephalic. Oropharynx and nasopharynx clear.  NECK:  Supple, no jugular venous distention. No thyroid enlargement, no  tenderness.  LUNGS: Normal breath sounds bilaterally, no wheezing, rales,rhonchi or crepitation. No use of accessory muscles of respiration.  CARDIOVASCULAR: S1, S2 tachycardic. ,irregular.No murmurs, rubs, or gallops.  ABDOMEN: Soft, nontender, nondistended. Bowel sounds present. No organomegaly or mass.  EXTREMITIES: No pedal edema, cyanosis, or clubbing.  NEUROLOGIC: Cranial nerves II through XII are intact. Muscle strength 5/5 in all extremities. Sensation intact. Gait not checked.  PSYCHIATRIC: The patient is alert and oriented x 3.  SKIN: No obvious rash, lesion, or ulcer.    LABORATORY PANEL:   CBC  Recent Labs Lab 12/27/16 0912  WBC 11.7*  HGB 13.7  HCT 41.0  PLT 228   ------------------------------------------------------------------------------------------------------------------  Chemistries   Recent Labs Lab 12/28/16 0443 12/30/16 0521  NA 141 141  K 3.4* 3.7  CL 105 102  CO2 31 32  GLUCOSE 99 96  BUN 13 25*  CREATININE 0.76 0.87  CALCIUM 8.6* 8.9  MG 2.0  --    ------------------------------------------------------------------------------------------------------------------  Cardiac Enzymes  Recent Labs Lab 12/27/16 0912  TROPONINI <0.03   ------------------------------------------------------------------------------------------------------------------  RADIOLOGY:  No results found.  EKG:   Orders placed or performed during the hospital encounter of 12/27/16  . ED EKG  . ED EKG  . EKG 12-Lead  . EKG 12-Lead    ASSESSMENT AND PLAN:   # Afib with RVR 3 second pause overnight while sleeping HR into 150 with ambulation Did not get Cardizem in AM Restart Cardizem at lower dose of 30 mg Q 8 hr. Also on metoprolol Cardiology to see May need cardioversion if  difficult to control On Eliquis  #  Acute on chronic diastolic heart failure: EF 65% by echocardiogram - IV Lasix, Beta blockers - Input and Output - Counseled to limit fluids and  Salt - Monitor Bun/Cr and Potassium -Cardiology following  # Acute respiratory failure with hypoxia secondary to CHF exacerbation Resolved  # Hypertension: Controlled  # diabetes mellitus type 2 ssi  All the records are reviewed and case discussed with Care Management/Social Worker Management plans discussed with the patient, family and they are in agreement.  CODE STATUS:full  TOTAL TIME TAKING CARE OF THIS PATIENT: 67minutes.   POSSIBLE D/C IN 1-2 DAYS, DEPENDING ON CLINICAL CONDITION.  Hillary Bow R M.D on 12/30/2016 at 12:39 PM  Between 7am to 6pm - Pager - [phone]  After 6pm go to [email or web address] - password [email or web address] [place] [facility]  Office  [phone]  CC: Primary care physician; Ezequiel Kayser, MD  Note: This dictation was prepared with Dragon dictation along with smaller phrase technology. Any transcriptional errors that result from this process are unintentional.

## 2016-12-30 NOTE — Plan of Care (Signed)
Problem: Cardiac: Goal: Ability to achieve and maintain adequate cardiopulmonary perfusion will improve Outcome: Progressing HR controlled 80-100's this shift.  No complaints of palpitations.

## 2016-12-31 ENCOUNTER — Inpatient Hospital Stay: Payer: Medicare Other | Attending: Oncology | Admitting: Oncology

## 2016-12-31 DIAGNOSIS — E119 Type 2 diabetes mellitus without complications: Secondary | ICD-10-CM | POA: Insufficient documentation

## 2016-12-31 DIAGNOSIS — Z923 Personal history of irradiation: Secondary | ICD-10-CM | POA: Insufficient documentation

## 2016-12-31 DIAGNOSIS — M8580 Other specified disorders of bone density and structure, unspecified site: Secondary | ICD-10-CM | POA: Insufficient documentation

## 2016-12-31 DIAGNOSIS — G4730 Sleep apnea, unspecified: Secondary | ICD-10-CM | POA: Insufficient documentation

## 2016-12-31 DIAGNOSIS — D649 Anemia, unspecified: Secondary | ICD-10-CM | POA: Insufficient documentation

## 2016-12-31 DIAGNOSIS — G4700 Insomnia, unspecified: Secondary | ICD-10-CM | POA: Insufficient documentation

## 2016-12-31 DIAGNOSIS — Z87442 Personal history of urinary calculi: Secondary | ICD-10-CM | POA: Insufficient documentation

## 2016-12-31 DIAGNOSIS — F329 Major depressive disorder, single episode, unspecified: Secondary | ICD-10-CM | POA: Insufficient documentation

## 2016-12-31 DIAGNOSIS — Z87891 Personal history of nicotine dependence: Secondary | ICD-10-CM | POA: Insufficient documentation

## 2016-12-31 DIAGNOSIS — I700 Atherosclerosis of aorta: Secondary | ICD-10-CM | POA: Insufficient documentation

## 2016-12-31 DIAGNOSIS — J449 Chronic obstructive pulmonary disease, unspecified: Secondary | ICD-10-CM | POA: Insufficient documentation

## 2016-12-31 DIAGNOSIS — Z794 Long term (current) use of insulin: Secondary | ICD-10-CM | POA: Insufficient documentation

## 2016-12-31 DIAGNOSIS — E785 Hyperlipidemia, unspecified: Secondary | ICD-10-CM | POA: Insufficient documentation

## 2016-12-31 DIAGNOSIS — E538 Deficiency of other specified B group vitamins: Secondary | ICD-10-CM | POA: Insufficient documentation

## 2016-12-31 DIAGNOSIS — K219 Gastro-esophageal reflux disease without esophagitis: Secondary | ICD-10-CM | POA: Insufficient documentation

## 2016-12-31 DIAGNOSIS — I509 Heart failure, unspecified: Secondary | ICD-10-CM | POA: Insufficient documentation

## 2016-12-31 DIAGNOSIS — C50212 Malignant neoplasm of upper-inner quadrant of left female breast: Secondary | ICD-10-CM | POA: Insufficient documentation

## 2016-12-31 DIAGNOSIS — I499 Cardiac arrhythmia, unspecified: Secondary | ICD-10-CM | POA: Insufficient documentation

## 2016-12-31 DIAGNOSIS — Z79811 Long term (current) use of aromatase inhibitors: Secondary | ICD-10-CM | POA: Insufficient documentation

## 2016-12-31 DIAGNOSIS — Z79899 Other long term (current) drug therapy: Secondary | ICD-10-CM | POA: Insufficient documentation

## 2016-12-31 DIAGNOSIS — I2510 Atherosclerotic heart disease of native coronary artery without angina pectoris: Secondary | ICD-10-CM | POA: Insufficient documentation

## 2016-12-31 DIAGNOSIS — E669 Obesity, unspecified: Secondary | ICD-10-CM | POA: Insufficient documentation

## 2016-12-31 DIAGNOSIS — Z170 Estrogen receptor positive status [ER+]: Secondary | ICD-10-CM | POA: Insufficient documentation

## 2016-12-31 LAB — CBC
HEMATOCRIT: 40.5 % (ref 35.0–47.0)
HEMOGLOBIN: 13.5 g/dL (ref 12.0–16.0)
MCH: 31.9 pg (ref 26.0–34.0)
MCHC: 33.3 g/dL (ref 32.0–36.0)
MCV: 96 fL (ref 80.0–100.0)
Platelets: 226 10*3/uL (ref 150–440)
RBC: 4.22 MIL/uL (ref 3.80–5.20)
RDW: 14.6 % — ABNORMAL HIGH (ref 11.5–14.5)
WBC: 11.3 10*3/uL — AB (ref 3.6–11.0)

## 2016-12-31 LAB — GLUCOSE, CAPILLARY
GLUCOSE-CAPILLARY: 125 mg/dL — AB (ref 65–99)
GLUCOSE-CAPILLARY: 118 mg/dL — AB (ref 65–99)

## 2016-12-31 MED ORDER — FUROSEMIDE 40 MG PO TABS: 40.0000 mg | ORAL_TABLET | Freq: Every day | ORAL | 0 refills | Status: DC

## 2016-12-31 MED ORDER — FUROSEMIDE 40 MG PO TABS: 40.0000 mg | ORAL_TABLET | Freq: Every day | ORAL | Status: DC

## 2016-12-31 MED ORDER — APIXABAN 5 MG PO TABS
5.0000 mg | ORAL_TABLET | Freq: Two times a day (BID) | ORAL | Status: DC
  Administered 2016-12-31: 5 mg via ORAL
  Filled 2016-12-31: qty 1

## 2016-12-31 MED ORDER — METOPROLOL TARTRATE 50 MG PO TABS: 50.0000 mg | ORAL_TABLET | Freq: Two times a day (BID) | ORAL | 0 refills | Status: AC

## 2016-12-31 MED ORDER — APIXABAN 5 MG PO TABS: 5.0000 mg | ORAL_TABLET | Freq: Two times a day (BID) | ORAL | 0 refills | Status: DC

## 2016-12-31 MED ORDER — DILTIAZEM HCL ER COATED BEADS 120 MG PO CP24
120.0000 mg | ORAL_CAPSULE | Freq: Every day | ORAL | Status: DC
  Administered 2016-12-31: 120 mg via ORAL
  Filled 2016-12-31: qty 1

## 2016-12-31 MED ORDER — DILTIAZEM HCL ER COATED BEADS 120 MG PO CP24: 120.0000 mg | ORAL_CAPSULE | Freq: Every day | ORAL | 0 refills | Status: AC

## 2016-12-31 MED ORDER — FUROSEMIDE 40 MG PO TABS: 40.0000 mg | ORAL_TABLET | Freq: Every day | ORAL | 0 refills | Status: AC

## 2016-12-31 NOTE — Care Management (Signed)
Patient to discharge home today.  She does not feel that she is in need of home health nurse as she will go to the [facility].  Discussed calling HF Clinic for questions and symptons.  Discussed daily weights and to call the Heart Failure Clinic for intervention if needed.  Verbalizes understanding.  Provided patient 30 day Eliquis coupon

## 2016-12-31 NOTE — Progress Notes (Signed)
Discharge paperwork reviewed with patient. Information regarding follow-up appointments, medications and education for all newly prescribed meds was provided, all questions answered. Peripheral IV removed, catheter intact. Heart monitor removed, returned to the nurse station. Transport requested via wheelchair to the lobby for discharge.    

## 2016-12-31 NOTE — Progress Notes (Deleted)
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  INTERVAL HISTORY: Patient returns to clinic today for routine 3 month evaluation. She continues to tolerate letrozole well without significant side effects.  She currently feels well and is asymptomatic. She has no neurologic complaints. She denies any recent fevers. She has no chest pain or shortness of breath. She denies any nausea, vomiting, constipation, or diarrhea. She has no urinary complaints. Patient offers no specific complaints today.  REVIEW OF SYSTEMS:   Review of Systems  Constitutional: Negative for fever, malaise/fatigue and weight loss.  Respiratory: Negative.  Negative for cough and shortness of breath.   Cardiovascular: Negative.  Negative for chest pain and leg swelling.  Gastrointestinal: Negative.  Negative for abdominal pain.  Genitourinary: Negative.   Musculoskeletal: Negative.   Neurological: Negative.  Negative for sensory change and weakness.  Psychiatric/Behavioral: Negative.  The patient is not nervous/anxious.     As per HPI. Otherwise, a complete review of systems is negative.  PAST MEDICAL HISTORY: Past Medical History:  Diagnosis Date  . Abnormal finding on mammography 11/21/2012   Overview:  Biopsied 11/25/2012 by Dr. Pat Patrick; was due for screening mammogram February 2015.   Marland Kitchen Acid reflux 03/31/2014  . Anemia    h/o  . Apnea, sleep 03/31/2014   Overview:  on CPAP   . B12 deficiency 04/01/2014  . BP (high blood pressure) 03/31/2014  . Breast cancer ([place])   . Cancer (HCC)    BREAST  . Cholelithiasis 05/27/2015   Requiring Cholecystectomy 08/14/2014- Dr. Pat Patrick   . Chronic LBP 05/02/2015  . Clinical depression 03/31/2014   Overview:   S/P suicide attempt 02/1995   . COPD (chronic obstructive pulmonary disease) ([place])    per cxr on 07-10-15  . Diabetes mellitus, type 2 ([place]) 03/31/2014  . Dysrhythmia    h/o fluttering occ  . HLD (hyperlipidemia) 03/31/2014  . Insomnia, persistent 04/01/2014  . Obesity 05/27/2015  . Personal history of radiation therapy   . 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 ABDOMINAL HYSTERECTOMY     Total  . BACK SURGERY     Multiple  . BREAST BIOPSY Right 06/20/09   neg  . BREAST BIOPSY Left 12/11/12   neg  . BREAST BIOPSY Left 06/04/2015  . BREAST LUMPECTOMY WITH SENTINEL LYMPH NODE BIOPSY Left 07/18/2015   Procedure: BREAST LUMPECTOMY WITH SENTINEL LYMPH NODE BX;  Surgeon: Hubbard Robinson, MD;  Location: [facility] ORS;  Service: General;  Laterality: Left;  . CARPAL TUNNEL RELEASE Right   . DEBRIDEMENT AND CLOSURE WOUND Left 10/01/2015   Procedure: DEBRIDEMENT AND CLOSURE WOUND;  Surgeon: Clayburn Pert, MD;  Location: [facility] ORS;  Service: General;  Laterality: Left;  . KNEE SURGERY Left   . LAPAROSCOPIC CHOLECYSTECTOMY  08/14/2010   Dr. Pat Patrick  . NECK SURGERY     Multiple  . SENTINEL NODE BIOPSY Left 07/18/2015   Procedure: SENTINEL NODE BIOPSY/NEEDLE LOC;  Surgeon: Hubbard Robinson, MD;  Location: [facility] ORS;  Service: General;  Laterality: Left;  . ULNAR NERVE REPAIR      FAMILY HISTORY Family History  Problem Relation Age of Onset  . Hypertension    . Stroke    .  Diabetes    . Heart disease    . Hypertension Mother   . Diabetes Brother   . Breast cancer Neg Hx        ADVANCED DIRECTIVES:    HEALTH MAINTENANCE: Social History  Substance Use Topics  . Smoking status: Former Smoker    Packs/day: 1.00    Years: 40.00    Types: Cigarettes    Quit date: 09/07/2015  . Smokeless tobacco: Never Used  . Alcohol use No     Colonoscopy:  PAP:  Bone density:  Lipid panel:  Allergies  Allergen Reactions  .  Chlorthalidone Other (See Comments)  . Ciprofloxacin Swelling    Swelling of hands and feet  . Codeine Nausea Only  . Diclofenac     Other reaction(s): Abdominal Pain  . Hydrochlorothiazide     Other reaction(s): Other (See Comments) Leg cramps  . Adhesive [Tape] Rash    IF LEFT ON FOR LONG PERIODS-PAPER TAPE OK TO USE  . Betadine [Povidone Iodine] Rash    No current facility-administered medications for this visit.    No current outpatient prescriptions on file.   Facility-Administered Medications Ordered in Other Visits  Medication Dose Route Frequency Provider Last Rate Last Dose  . acetaminophen (TYLENOL) tablet 650 mg  650 mg Oral Q6H PRN Hillary Bow, MD       Or  . acetaminophen (TYLENOL) suppository 650 mg  650 mg Rectal Q6H PRN Srikar Sudini, MD      . albuterol (PROVENTIL) (2.5 MG/3ML) 0.083% nebulizer solution 2.5 mg  2.5 mg Nebulization Q2H PRN Srikar Sudini, MD      . aspirin EC tablet 81 mg  81 mg Oral Daily Hillary Bow, MD   81 mg at 12/30/16 0853  . atorvastatin (LIPITOR) tablet 20 mg  20 mg Oral QHS Hillary Bow, MD   20 mg at 12/30/16 2146  . cholecalciferol (VITAMIN D) tablet 5,000 Units  5,000 Units Oral Daily Hillary Bow, MD   5,000 Units at 12/30/16 [phone]  . diltiazem (CARDIZEM) tablet 30 mg  30 mg Oral Q8H Srikar Sudini, MD   30 mg at 12/30/16 2146  . enoxaparin (LOVENOX) injection 40 mg  40 mg Subcutaneous Q24H Srikar Sudini, MD   40 mg at 12/30/16 1213  . furosemide (LASIX) injection 40 mg  40 mg Intravenous BID Epifanio Lesches, MD   40 mg at 12/30/16 1723  . HYDROcodone-acetaminophen (NORCO/VICODIN) 5-325 MG per tablet 1 tablet  1 tablet Oral Q6H PRN Srikar Sudini, MD      . insulin aspart (novoLOG) injection 0-15 Units  0-15 Units Subcutaneous TID WC Hillary Bow, MD   3 Units at 12/30/16 1213  . insulin aspart (novoLOG) injection 0-5 Units  0-5 Units Subcutaneous QHS Srikar Sudini, MD      . insulin aspart protamine- aspart (NOVOLOG MIX 70/30)  injection 10 Units  10 Units Subcutaneous Q breakfast Epifanio Lesches, MD   10 Units at 12/30/16 0852   And  . insulin aspart protamine- aspart (NOVOLOG MIX 70/30) injection 20 Units  20 Units Subcutaneous Q supper Epifanio Lesches, MD   15 Units at 12/30/16 1723  . letrozole [facility]) tablet 2.5 mg  2.5 mg Oral Daily Hillary Bow, MD   2.5 mg at 12/30/16 0856  . lisinopril (PRINIVIL,ZESTRIL) tablet 40 mg  40 mg Oral Julianne Rice, MD   40 mg at 12/30/16 0657  . metoprolol (LOPRESSOR) tablet 50 mg  50 mg Oral BID Teodoro Spray, MD  50 mg at 12/30/16 2146  . naproxen (NAPROSYN) tablet 250 mg  250 mg Oral BID WC Srikar Sudini, MD   250 mg at 12/30/16 1723  . ondansetron (ZOFRAN) tablet 4 mg  4 mg Oral Q6H PRN Hillary Bow, MD       Or  . ondansetron (ZOFRAN) injection 4 mg  4 mg Intravenous Q6H PRN Srikar Sudini, MD      . pantoprazole (PROTONIX) EC tablet 40 mg  40 mg Oral QAC breakfast Hillary Bow, MD   40 mg at 12/30/16 0852  . polyethylene glycol (MIRALAX / GLYCOLAX) packet 17 g  17 g Oral Daily PRN Srikar Sudini, MD      . sodium chloride flush (NS) 0.9 % injection 3 mL  3 mL Intravenous Q12H Hillary Bow, MD   3 mL at 12/30/16 2147  . traZODone (DESYREL) tablet 150 mg  150 mg Oral QHS Hillary Bow, MD   150 mg at 12/30/16 2146  . vitamin B-12 (CYANOCOBALAMIN) tablet 500 mcg  500 mcg Oral Daily Hillary Bow, MD   500 mcg at 12/30/16 1049    OBJECTIVE: There were no vitals filed for this visit.   There is no height or weight on file to calculate BMI.    ECOG FS:0 - Asymptomatic  General: Well-developed, well-nourished, no acute distress. Eyes: Pink conjunctiva, anicteric sclera. Breasts: Left breast with no palpable lumps or masses. Exam recently performed by another provider.  Lungs: Clear to auscultation bilaterally. Heart: Regular rate and rhythm. No rubs, murmurs, or gallops. Abdomen: Soft, nontender, nondistended. No organomegaly noted, normoactive bowel  sounds. Musculoskeletal: No edema, cyanosis, or clubbing. Neuro: Alert, answering all questions appropriately. Cranial nerves grossly intact. Skin: No rashes or petechiae noted. Psych: Normal affect.   LAB RESULTS:  Lab Results  Component Value Date   NA 141 12/30/2016   K 3.7 12/30/2016   CL 102 12/30/2016   CO2 32 12/30/2016   GLUCOSE 96 12/30/2016   BUN 25 (H) 12/30/2016   CREATININE 0.87 12/30/2016   CALCIUM 8.9 12/30/2016   PROT 7.3 07/10/2015   ALBUMIN 3.6 07/10/2015   AST 17 07/10/2015   ALT 16 07/10/2015   ALKPHOS 62 07/10/2015   BILITOT 0.6 07/10/2015   GFRNONAA >60 12/30/2016   GFRAA >60 12/30/2016    Lab Results  Component Value Date   WBC 11.7 (H) 12/27/2016   NEUTROABS 9.7 (H) 12/27/2016   HGB 13.7 12/27/2016   HCT 41.0 12/27/2016   MCV 95.8 12/27/2016   PLT 228 12/27/2016     STUDIES: Dg Chest 2 View  Result Date: 12/27/2016 CLINICAL DATA:  Intermittent shortness of breath for 1 week, COPD EXAM: CHEST  2 VIEW COMPARISON:  07/10/2015 FINDINGS: Cardiomediastinal silhouette is stable. Hyperinflation is noted. Bilateral reticular interstitial prominence and peripheral fibrotic changes with slight worsening from prior exam especially in lower lobes. No definite superimposed infiltrate or pulmonary edema. Trace bilateral pleural effusion. Osteopenia and degenerative changes thoracic spine. Stable compression deformity lower thoracic spine. IMPRESSION: Hyperinflation is noted. Bilateral reticular interstitial prominence and peripheral fibrotic changes with slight worsening from prior exam especially in lower lobes. No definite superimposed infiltrate or pulmonary edema. Trace bilateral pleural effusion. Electronically Signed   By: Lahoma Crocker M.D.   On: 12/27/2016 09:12   Ct Angio Chest Pe W And/or Wo Contrast  Result Date: 12/27/2016 CLINICAL DATA:  Increase shortness of breath with exertion, elevated blood pressure over the last several months, some swelling of  the ankles EXAM: CT  ANGIOGRAPHY CHEST WITH CONTRAST TECHNIQUE: Multidetector CT imaging of the chest was performed using the standard protocol during bolus administration of intravenous contrast. Multiplanar CT image reconstructions and MIPs were obtained to evaluate the vascular anatomy. CONTRAST:  75 cc Isovue 370 COMPARISON:  Chest x-ray of 12/27/2016 FINDINGS: Cardiovascular: The pulmonary arteries are well opacified. There is no evidence of acute pulmonary embolism. There is partial opacification of the thoracic aorta with no acute abnormality noted. Moderate thoracic aortic atherosclerosis is present. The mid ascending thoracic aorta measures 30 mm in diameter. Coronary artery calcifications are noted. Moderate cardiomegaly is present. Mediastinum/Nodes: No mediastinal or hilar adenopathy is seen with only small lymph nodes present. Lungs/Pleura: Chronic interstitial changes are present anteriorly within the upper lobes. There are small to moderate size bilateral pleural effusions present right slightly larger than left. Opacification of a portion of the right lower lobe may be due to atelectasis, but pneumonia cannot be excluded. There are prominent interstitial markings at the lung bases is well and interstitial edema is a definite consideration. Upper Abdomen: On the few images through the upper abdomen there is suggestion of left adrenal enlargement which could be due to adenoma but cannot be assessed well on this CT of the chest. Musculoskeletal: There are degenerative changes in the mid to lower thoracic spine. Review of the MIP images confirms the above findings. IMPRESSION: 1. No evidence of acute pulmonary embolism. 2. Moderate thoracic aortic atherosclerosis. 3. Coronary artery calcifications and moderate cardiomegaly. 4. Prominent interstitial markings with bilateral pleural effusions which may indicate interstitial edema. Also, pneumonia cannot be excluded at the right lung base. 5. Suspect  enlargement of left adrenal gland, not well visualized on this CT of the chest. Possible incidental adrenal adenoma. Electronically Signed   By: Ivar Drape M.D.   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  PLAN:    1. 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 Patient had low risk Oncotype score, therefore did not require adjuvant chemotherapy. Continue letrozole daily for 5 years completing in March 2022. Her most recent mammogram on June 02, 2016 was reported as BI-RADS 2, repeat in one year. Return to clinic in 6 months for routine evaluation. 2. Osteopenia: Bone marrow density on April 12, 2016 reported T score of -1.1. Repeat in one year.  Patient will return to clinic Patient expressed understanding and was in agreement with this plan. She also understands that She can call clinic at any time with any questions, concerns, or complaints.    Lloyd Huger, MD   12/31/2016 12:10 AM

## 2016-12-31 NOTE — Care Management Important Message (Signed)
Important Message  Patient Details  Name: Robin Huff MRN: [id number] Date of Birth: [birth year]   Medicare Important Message Given:  Yes    Katrina Stack, RN 12/31/2016, 1:46 PM

## 2016-12-31 NOTE — Progress Notes (Addendum)
Inpatient Diabetes Program Recommendations  AACE/ADA: New Consensus Statement on Inpatient Glycemic Control (2015)  Target Ranges:  Prepandial:   less than 140 mg/dL      Peak postprandial:   less than 180 mg/dL (1-2 hours)      Critically ill patients:  140 - 180 mg/dL   Lab Results  Component Value Date   GLUCAP 125 (H) 12/31/2016   HGBA1C 6.3 (H) 12/27/2016    Review of Glycemic Control  Results for Bines, Cristen F (MRN [id number]) as of 12/31/2016 10:23  Ref. Range 12/30/2016 11:42 12/30/2016 16:57 12/30/2016 21:03 12/31/2016 07:47  Glucose-Capillary Latest Ref Range: 65 - 99 mg/dL 162 (H) 95 84 125 (H)    Diabetes history: Type 2 Outpatient Diabetes medications: Novolin mix 70/30 20 units qam, 40 units qpm Current orders for Inpatient glycemic control: Novolog mix 70/30 15 units with breakfast, 70/30- 30 units pre-supper, Novolog 0-15 units tid, Novolog 0-5 units qhs  Inpatient Diabetes Program Recommendations: Consider decreasing Novolog correction to sensitive correction tid and continue Novolog 0-5 units qhs.   Met with patient at her bedside.  Confirms she is taking insulin as ordered and does some regulating of her insulin based on her blood sugars- tells me her MD is aware. She takes 10-25 units in the am and 20-40 units 70/30 in the pm.  Reviewed meal planning guidelines using the plate method diagram.  Patient did not want an outpatient dietitian referral for additional  education.   Julie Montpellier, RN, BA, MHA, CDE Diabetes Coordinator Inpatient Diabetes Program  [phone] (Team Pager) [phone] ([facility] Office) 12/31/2016 10:28 AM    

## 2016-12-31 NOTE — Discharge Instructions (Signed)
Heart Failure Clinic appointment on January 07, 2017 at 9:00am with Darylene Price, [place]. Please call [phone] to reschedule.    - Daily fluids < 2 liters. - Low salt diet - Check weight everyday and keep log. Take to your doctors appt. - Take extra dose of lasix if you gain more than 3 pounds weight.

## 2016-12-31 NOTE — Plan of Care (Signed)
Problem: Activity: Goal: Risk for activity intolerance will decrease Outcome: Progressing Patient ambulates independently in the room with no complaints, tolerates well.

## 2017-01-03 NOTE — Discharge Summary (Signed)
[place] [facility] [place] NAME: Robin Huff    MR#:  [id number]  DATE OF BIRTH:  03-Aug-1946  DATE OF ADMISSION:  12/27/2016 ADMITTING PHYSICIAN: Hillary Bow, MD  DATE OF DISCHARGE: 12/31/2016  2:40 PM  PRIMARY CARE PHYSICIAN: THIES, DAVID, MD   ADMISSION DIAGNOSIS:  Shortness of breath [R06.02] Hypoxia [R09.02]  DISCHARGE DIAGNOSIS:  Active Problems:   CHF (congestive heart failure) ([place])   SECONDARY DIAGNOSIS:   Past Medical History:  Diagnosis Date  . Abnormal finding on mammography 11/21/2012   Overview:  Biopsied 11/25/2012 by Dr. Pat Patrick; was due for screening mammogram February 2015.   Marland Kitchen Acid reflux 03/31/2014  . Anemia    h/o  . Apnea, sleep 03/31/2014   Overview:  on CPAP   . B12 deficiency 04/01/2014  . BP (high blood pressure) 03/31/2014  . Breast cancer ([place])   . Cancer (HCC)    BREAST  . Cholelithiasis 05/27/2015   Requiring Cholecystectomy 08/14/2014- Dr. Pat Patrick   . Chronic LBP 05/02/2015  . Clinical depression 03/31/2014   Overview:  S/P suicide attempt 02/1995   . COPD (chronic obstructive pulmonary disease) ([place])    per cxr on 07-10-15  . Diabetes mellitus, type 2 ([place]) 03/31/2014  . Dysrhythmia    h/o fluttering occ  . HLD (hyperlipidemia) 03/31/2014  . Insomnia, persistent 04/01/2014  . Obesity 05/27/2015  . Personal history of radiation therapy   . 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 female with a known history of Hypertension, diabetes, arthritis presents to the emergency room complaining of 1 week off shortness of breath. She has noticed orthopnea and shortness of breath on exertion. No chest pain. Has had chronic on and off palpitations. Seen Dr. Nehemiah Massed in the past for palpitations with no arrhythmias found. No recent change in medications or diet or fluid intake. Here in the emergency room patient had x-ray and CT scan of the chest which showed  interstitial edema with bilateral small pleural effusions. Elevated BNP. She drops to 88% saturation on room air at rest.   HOSPITAL COURSE:   # Afib with RVR Started on cardizem and coreg changed to metoprolol by Dr. Ubaldo Glassing. She had one episode of 3 second pause which was asymptomatic. After this her cardizem was reduced in dose and monitored for 24 hrs. HR well controlled <100 on ambulation and no pauses. On Eliquis  #  Acute on chronic diastolic heart failure: EF 65% by echocardiogram Treated with - IV Lasix, Beta blocker - Counseled to limit fluids and Salt - Monitor Bun/Cr and Potassium and remained stable -Cardiology  Dr. Ubaldo Glassing saw the patient in the hospital  # Acute respiratory failure with hypoxia secondary to CHF exacerbation Resolved sats 96% on room air  # Hypertension: Controlled  # diabetes mellitus type 2 ssi in hospital  Discharge home in stable condition to follow up with PCP and Dr. Ubaldo Glassing in 1 week  CONSULTS OBTAINED:  Treatment Team:  Teodoro Spray, MD  DRUG ALLERGIES:   Allergies  Allergen Reactions  . Chlorthalidone Other (See Comments)  . Ciprofloxacin Swelling    Swelling of hands and feet  . Codeine Nausea Only  . Diclofenac     Other reaction(s): Abdominal Pain  . Hydrochlorothiazide     Other reaction(s): Other (See Comments) Leg cramps  . Adhesive [Tape] Rash    IF LEFT ON FOR  LONG PERIODS-PAPER TAPE OK TO USE  . Betadine [Povidone Iodine] Rash    DISCHARGE MEDICATIONS:   Discharge Medication List as of 12/31/2016  2:10 PM    START taking these medications   Details  apixaban (ELIQUIS) 5 MG TABS tablet Take 1 tablet (5 mg total) by mouth 2 (two) times daily., Starting Fri 12/31/2016, Normal    diltiazem (CARDIZEM CD) 120 MG 24 hr capsule Take 1 capsule (120 mg total) by mouth daily., Starting Sat 01/01/2017, Normal    metoprolol (LOPRESSOR) 50 MG tablet Take 1 tablet (50 mg total) by mouth 2 (two) times daily., Starting Fri 12/31/2016,  Normal      CONTINUE these medications which have CHANGED   Details  furosemide (LASIX) 40 MG tablet Take 1 tablet (40 mg total) by mouth daily., Starting Sat 01/01/2017, Normal      CONTINUE these medications which have NOT CHANGED   Details  atorvastatin (LIPITOR) 20 MG tablet Take 1 tablet by mouth at bedtime., Starting Tue 10/29/2014, Historical Med    CALCIUM PO Take 1 tablet by mouth daily., Historical Med    cetirizine (ZYRTEC) 10 MG tablet Take 1 tablet by mouth daily as needed., Historical Med    Cholecalciferol (D-5000) 5000 UNITS TABS Take 1 tablet by mouth daily., Historical Med    cyanocobalamin (V-R VITAMIN B-12) 500 MCG tablet Take 1 tablet by mouth daily., Historical Med    insulin NPH-regular Human (NOVOLIN 70/30) (70-30) 100 UNIT/ML injection Inject 20-40 Units into the skin 2 (two) times daily. 20 units in am and up to 40 units qhs depending on her bs, Starting Tue 10/29/2014, Historical Med    letrozole (FEMARA) 2.5 MG tablet Take 1 tablet (2.5 mg total) by mouth daily., Starting Thu 01/01/2016, Normal    Multiple Vitamin (MULTIVITAMIN) capsule Take 1 capsule by mouth daily., Historical Med    naproxen sodium (RA NAPROXEN SODIUM) 220 MG tablet Take 1 tablet by mouth 2 (two) times daily., Historical Med    omeprazole (PRILOSEC) 20 MG capsule Take 1 capsule by mouth every morning. , Starting Tue 10/29/2014, Historical Med    traZODone (DESYREL) 150 MG tablet Take 1 tablet by mouth at bedtime., Starting Tue 10/29/2014, Historical Med    HYDROcodone-acetaminophen (NORCO/VICODIN) 5-325 MG tablet TK 1 T PO  Q 6 H PRF MODERATE PAIN, Historical Med      STOP taking these medications     amLODipine (NORVASC) 10 MG tablet      aspirin EC 81 MG tablet      carvedilol (COREG) 25 MG tablet      lisinopril (PRINIVIL,ZESTRIL) 40 MG tablet         Today   VITAL SIGNS:  Blood pressure 110/70, pulse 90, temperature 97.7 F (36.5 C), temperature source Oral, resp. rate 18,  height 5' 6.25" (1.683 m), weight 97.3 kg (214 lb 9.6 oz), SpO2 95 %.  I/O:  No intake or output data in the 24 hours ending 01/03/17 0826  PHYSICAL EXAMINATION:  Physical Exam  GENERAL:  71 y.o.-year-old patient lying in the bed with no acute distress.  LUNGS: Normal breath sounds bilaterally, no wheezing, rales,rhonchi or crepitation. No use of accessory muscles of respiration.  CARDIOVASCULAR: S1, S2 normal. No murmurs, rubs, or gallops.  ABDOMEN: Soft, non-tender, non-distended. Bowel sounds present. No organomegaly or mass.  NEUROLOGIC: Moves all 4 extremities. PSYCHIATRIC: The patient is alert and oriented x 3.  SKIN: No obvious rash, lesion, or ulcer.   DATA REVIEW:  CBC  Recent Labs Lab 12/31/16 0335  WBC 11.3*  HGB 13.5  HCT 40.5  PLT 226    Chemistries   Recent Labs Lab 12/28/16 0443 12/30/16 0521  NA 141 141  K 3.4* 3.7  CL 105 102  CO2 31 32  GLUCOSE 99 96  BUN 13 25*  CREATININE 0.76 0.87  CALCIUM 8.6* 8.9  MG 2.0  --     Cardiac Enzymes  Recent Labs Lab 12/27/16 0912  TROPONINI <0.03    Microbiology Results  No results found for this or any previous visit.  RADIOLOGY:  No results found.  Follow up with PCP in 1 week.  Management plans discussed with the patient, family and they are in agreement.  CODE STATUS:  Code Status History    Date Active Date Inactive Code Status Order ID Comments User Context   12/27/2016 12:51 PM 12/31/2016  5:40 PM Full Code [id number]  Hillary Bow, MD ED      TOTAL TIME TAKING CARE OF THIS PATIENT ON DAY OF DISCHARGE: more than 30 minutes.   Hillary Bow R M.D on 01/03/2017 at 8:26 AM  Between 7am to 6pm - Pager - [phone]  After 6pm go to [email or web address] - password [email or web address] [facility]  Office  [phone]  CC: Primary care physician; Ezequiel Kayser, MD  Note: This dictation was prepared with Dragon dictation along with smaller phrase technology. Any transcriptional errors  that result from this process are unintentional.

## 2017-01-07 ENCOUNTER — Encounter: Payer: Self-pay | Admitting: Family

## 2017-01-07 ENCOUNTER — Ambulatory Visit: Payer: Medicare Other | Attending: Family | Admitting: Family

## 2017-01-07 ENCOUNTER — Ambulatory Visit: Payer: Medicare Other | Admitting: Family

## 2017-01-07 VITALS — BP 120/93 | HR 65 | Resp 18 | Ht 66.0 in | Wt 215.1 lb

## 2017-01-07 DIAGNOSIS — Z885 Allergy status to narcotic agent status: Secondary | ICD-10-CM | POA: Diagnosis not present

## 2017-01-07 DIAGNOSIS — Z853 Personal history of malignant neoplasm of breast: Secondary | ICD-10-CM | POA: Insufficient documentation

## 2017-01-07 DIAGNOSIS — G4700 Insomnia, unspecified: Secondary | ICD-10-CM | POA: Diagnosis not present

## 2017-01-07 DIAGNOSIS — E538 Deficiency of other specified B group vitamins: Secondary | ICD-10-CM | POA: Diagnosis not present

## 2017-01-07 DIAGNOSIS — Z881 Allergy status to other antibiotic agents status: Secondary | ICD-10-CM | POA: Diagnosis not present

## 2017-01-07 DIAGNOSIS — I10 Essential (primary) hypertension: Secondary | ICD-10-CM

## 2017-01-07 DIAGNOSIS — E669 Obesity, unspecified: Secondary | ICD-10-CM | POA: Insufficient documentation

## 2017-01-07 DIAGNOSIS — Z79811 Long term (current) use of aromatase inhibitors: Secondary | ICD-10-CM | POA: Insufficient documentation

## 2017-01-07 DIAGNOSIS — Z794 Long term (current) use of insulin: Secondary | ICD-10-CM | POA: Insufficient documentation

## 2017-01-07 DIAGNOSIS — Z888 Allergy status to other drugs, medicaments and biological substances status: Secondary | ICD-10-CM | POA: Insufficient documentation

## 2017-01-07 DIAGNOSIS — J449 Chronic obstructive pulmonary disease, unspecified: Secondary | ICD-10-CM | POA: Insufficient documentation

## 2017-01-07 DIAGNOSIS — Z79899 Other long term (current) drug therapy: Secondary | ICD-10-CM | POA: Insufficient documentation

## 2017-01-07 DIAGNOSIS — Z87891 Personal history of nicotine dependence: Secondary | ICD-10-CM | POA: Diagnosis not present

## 2017-01-07 DIAGNOSIS — I4891 Unspecified atrial fibrillation: Secondary | ICD-10-CM | POA: Insufficient documentation

## 2017-01-07 DIAGNOSIS — G4733 Obstructive sleep apnea (adult) (pediatric): Secondary | ICD-10-CM

## 2017-01-07 DIAGNOSIS — Z7901 Long term (current) use of anticoagulants: Secondary | ICD-10-CM | POA: Insufficient documentation

## 2017-01-07 DIAGNOSIS — I5032 Chronic diastolic (congestive) heart failure: Secondary | ICD-10-CM | POA: Diagnosis present

## 2017-01-07 DIAGNOSIS — I482 Chronic atrial fibrillation, unspecified: Secondary | ICD-10-CM

## 2017-01-07 DIAGNOSIS — K219 Gastro-esophageal reflux disease without esophagitis: Secondary | ICD-10-CM | POA: Diagnosis not present

## 2017-01-07 DIAGNOSIS — Z6834 Body mass index (BMI) 34.0-34.9, adult: Secondary | ICD-10-CM | POA: Diagnosis not present

## 2017-01-07 DIAGNOSIS — E785 Hyperlipidemia, unspecified: Secondary | ICD-10-CM | POA: Diagnosis not present

## 2017-01-07 DIAGNOSIS — E119 Type 2 diabetes mellitus without complications: Secondary | ICD-10-CM | POA: Diagnosis not present

## 2017-01-07 DIAGNOSIS — I110 Hypertensive heart disease with heart failure: Secondary | ICD-10-CM | POA: Diagnosis not present

## 2017-01-07 DIAGNOSIS — F329 Major depressive disorder, single episode, unspecified: Secondary | ICD-10-CM | POA: Diagnosis not present

## 2017-01-07 NOTE — Progress Notes (Signed)
Patient ID: Robin Huff, female    DOB: Sep 23, 1946, 71 y.o.   MRN: [id number]  HPI  Ms Robin Huff is a 71 y/o female with a history of GERD, anemia, obstructive sleep apnea (CPAP), B12 deficiency, HTN, depression, COPD, diabetes, atrial fibrillation, hyperlipidemia, remote tobacco use and chronic heart failure.   Reviewed last echo which was done 12/28/16 and it showed an EF of 60-65% along with mild MR/TR.   Admitted 12/27/16 with AF with RVR along with HF exacerbation. Cardiology consult obtained and medications were adjusted. Given IV diuretics initially. Discharged home after 4 days.   She presents today for her initial visit with a chief complaint of a mild amount of shortness of breath. This began a couple of days ago and occurs only on moderate exertion and is relieved upon rest. She has associated fatigue and light-headedness. Denies any swelling in her legs/abdomen.   Past Medical History:  Diagnosis Date  . Abnormal finding on mammography 11/21/2012   Overview:  Biopsied 11/25/2012 by Dr. Pat Huff; was due for screening mammogram February 2015.   Robin Huff Acid reflux 03/31/2014  . Anemia    h/o  . Apnea, sleep 03/31/2014   Overview:  on CPAP   . B12 deficiency 04/01/2014  . BP (high blood pressure) 03/31/2014  . Breast cancer (Robin Huff)   . Cancer (HCC)    BREAST  . Cholelithiasis 05/27/2015   Requiring Cholecystectomy 08/14/2014- Dr. Pat Huff   . Chronic LBP 05/02/2015  . Clinical depression 03/31/2014   Overview:  S/P suicide attempt 02/1995   . COPD (chronic obstructive pulmonary disease) (Robin Huff)    per cxr on 07-10-15  . Diabetes mellitus, type 2 (Robin Huff) 03/31/2014  . Dysrhythmia    h/o fluttering occ  . HLD (hyperlipidemia) 03/31/2014  . Insomnia, persistent 04/01/2014  . Obesity 05/27/2015  . Personal history of radiation therapy   . 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 ABDOMINAL HYSTERECTOMY     Total  . BACK SURGERY     Multiple  . BREAST BIOPSY Right  06/20/09   neg  . BREAST BIOPSY Left 12/11/12   neg  . BREAST BIOPSY Left 06/04/2015  . BREAST LUMPECTOMY WITH SENTINEL LYMPH NODE BIOPSY Left 07/18/2015   Procedure: BREAST LUMPECTOMY WITH SENTINEL LYMPH NODE BX;  Surgeon: Robin Robinson, MD;  Location: [facility];  Service: General;  Laterality: Left;  . CARPAL TUNNEL RELEASE Right   . DEBRIDEMENT AND CLOSURE WOUND Left 10/01/2015   Procedure: DEBRIDEMENT AND CLOSURE WOUND;  Surgeon: Robin Pert, MD;  Location: [facility];  Service: General;  Laterality: Left;  . KNEE SURGERY Left   . LAPAROSCOPIC CHOLECYSTECTOMY  08/14/2010   Dr. Pat Huff  . NECK SURGERY     Multiple  . SENTINEL NODE BIOPSY Left 07/18/2015   Procedure: SENTINEL NODE BIOPSY/NEEDLE LOC;  Surgeon: Robin Robinson, MD;  Location: [facility] ORS;  Service: General;  Laterality: Left;  . ULNAR NERVE REPAIR     Family History  Problem Relation Age of Onset  . Hypertension    . Stroke    . Diabetes    . Heart disease    . Hypertension Mother   . Diabetes Brother   . Breast cancer Neg Hx    Social History  Substance Use Topics  . Smoking status: Former Smoker    Packs/day: 1.00    Years: 40.00    Types: Cigarettes    Quit date: 09/07/2015  . Smokeless  tobacco: Never Used  . Alcohol use No   Allergies  Allergen Reactions  . Chlorthalidone Other (See Comments)  . Ciprofloxacin Swelling    Swelling of hands and feet  . Codeine Nausea Only  . Diclofenac     Other reaction(s): Abdominal Pain  . Hydrochlorothiazide     Other reaction(s): Other (See Comments) Leg cramps  . Adhesive [Tape] Rash    IF LEFT ON FOR LONG PERIODS-PAPER TAPE OK TO USE  . Betadine [Povidone Iodine] Rash   Prior to Admission medications   Medication Sig Start Date End Date Taking? Authorizing Provider  apixaban (ELIQUIS) 5 MG TABS tablet Take 1 tablet (5 mg total) by mouth 2 (two) times daily. 12/31/16  Yes Robin Sudini, MD  atorvastatin (LIPITOR) 20 MG tablet Take 1 tablet by mouth at  bedtime. 10/29/14  Yes Historical Provider, MD  CALCIUM PO Take 1 tablet by mouth daily.   Yes Historical Provider, MD  cetirizine (ZYRTEC) 10 MG tablet Take 1 tablet by mouth daily as needed.   Yes Historical Provider, MD  Cholecalciferol (D-5000) 5000 UNITS TABS Take 1 tablet by mouth daily.   Yes Historical Provider, MD  cyanocobalamin (V-R VITAMIN B-12) 500 MCG tablet Take 1 tablet by mouth daily.   Yes Historical Provider, MD  diltiazem (CARDIZEM CD) 120 MG 24 hr capsule Take 1 capsule (120 mg total) by mouth daily. 01/01/17  Yes Robin Sudini, MD  furosemide (LASIX) 40 MG tablet Take 1 tablet (40 mg total) by mouth daily. 01/01/17  Yes Robin Sudini, MD  HYDROcodone-acetaminophen (NORCO/VICODIN) 5-325 MG tablet TK 1 T PO  Q 6 H PRF MODERATE PAIN 10/01/15  Yes Historical Provider, MD  insulin NPH-regular Human (NOVOLIN 70/30) (70-30) 100 UNIT/ML injection Inject 20-40 Units into the skin 2 (two) times daily. 20 units in am and up to 40 units qhs depending on her bs 10/29/14  Yes Historical Provider, MD  letrozole (FEMARA) 2.5 MG tablet Take 1 tablet (2.5 mg total) by mouth daily. 01/01/16  Yes Robin Huger, MD  metoprolol (LOPRESSOR) 50 MG tablet Take 1 tablet (50 mg total) by mouth 2 (two) times daily. 12/31/16  Yes Robin Sudini, MD  Multiple Vitamin (MULTIVITAMIN) capsule Take 1 capsule by mouth daily.   Yes Historical Provider, MD  naproxen sodium (RA NAPROXEN SODIUM) 220 MG tablet Take 1 tablet by mouth 2 (two) times daily.   Yes Historical Provider, MD  omeprazole (PRILOSEC) 20 MG capsule Take 1 capsule by mouth every morning.  10/29/14  Yes Historical Provider, MD  traZODone (DESYREL) 150 MG tablet Take 1 tablet by mouth at bedtime. 10/29/14  Yes Historical Provider, MD   Review of Systems  Constitutional: Positive for fatigue (last couple of days). Negative for appetite change.  HENT: Negative for congestion, postnasal drip and sore throat.   Eyes: Negative.   Respiratory: Positive for shortness  of breath (last couple of days). Negative for cough and chest tightness.   Cardiovascular: Negative for chest pain, palpitations and leg swelling.  Gastrointestinal: Negative for abdominal distention and abdominal pain.  Endocrine: Negative.   Genitourinary: Negative.   Musculoskeletal: Negative for back pain and neck pain.  Skin: Negative.   Allergic/Immunologic: Negative.   Neurological: Positive for light-headedness (yesterday). Negative for dizziness.  Hematological: Negative for adenopathy. Bruises/bleeds easily.  Psychiatric/Behavioral: Positive for sleep disturbance (wakes up frequently; wearing CPAP nightly). Negative for dysphoric mood and suicidal ideas. The patient is not nervous/anxious.    Vitals:   01/07/17 3536  BP: (!) 120/93  Pulse: 65  Resp: 18  SpO2: 95%  Weight: 215 lb 2 oz (97.6 kg)  Height: 5\' 6"  (1.676 m)   Wt Readings from Last 3 Encounters:  01/07/17 215 lb 2 oz (97.6 kg)  12/31/16 214 lb 9.6 oz (97.3 kg)  07/02/16 230 lb 2.6 oz (104.4 kg)   Lab Results  Component Value Date   CREATININE 0.87 12/30/2016   CREATININE 0.76 12/28/2016   CREATININE 0.64 12/27/2016   Physical Exam  Constitutional: She is oriented to person, place, and time. She appears well-developed and well-nourished.  HENT:  Head: Normocephalic and atraumatic.  Eyes: Conjunctivae are normal. Pupils are equal, round, and reactive to light.  Neck: Normal range of motion. Neck supple. No JVD present.  Cardiovascular: Normal rate.  An irregular rhythm present.  Pulmonary/Chest: Effort normal. She has no wheezes. She has no rales.  Abdominal: Soft. She exhibits no distension. There is no tenderness.  Musculoskeletal: She exhibits no edema or tenderness.  Neurological: She is alert and oriented to person, place, and time.  Skin: Skin is warm and dry.  Psychiatric: She has a normal mood and affect. Her behavior is normal. Thought content normal.  Nursing note and vitals  reviewed.   Assessment & Plan:  1: Chronic Heart Failure with preserved ejection fraction- - NYHA class II - euvolemic - weight stable at 211 pounds at home. Instructed to call for an overnight weight gain of >2 pounds or a weekly weight gain of >5 pounds - not adding salt. Discussed a 2000mg  sodium diet and a low sodium cookbook was given to her - continue furosemide, metoprolol - saw cardiologist Ubaldo Glassing) 01/06/17 and returns to him early May 2018 - Reviewed BMP drawn on 12/30/16 shows potassium 3.7 and GFR >60  2: HTN- - BP looks good today - sees PCP Raechel Ache) August 2018  3: Atrial fibrillation- - holter monitor placed on 01/06/17 and to be worn for 48 hours - continue apixabane, diltiazem and metoprolol  4: Diabetes- - glucose this morning was 127 - A1c is 6.3% as of 12/27/16 - continue 70/30 insulin  5: Obstructive sleep apnea- - wearing CPAP nightly - does wake often during the night but wakes up rested.  Medication bottles were reviewed.  Return here in 1 month or sooner for any questions/problems before then

## 2017-01-07 NOTE — Patient Instructions (Signed)
Continue weighing daily and call for an overnight weight gain of > 2 pounds or a weekly weight gain of >5 pounds. 

## 2017-01-09 DIAGNOSIS — I4891 Unspecified atrial fibrillation: Secondary | ICD-10-CM | POA: Insufficient documentation

## 2017-01-10 ENCOUNTER — Telehealth: Payer: Self-pay | Admitting: *Deleted

## 2017-01-10 NOTE — Telephone Encounter (Signed)
Pharmacy notified.

## 2017-01-10 NOTE — Telephone Encounter (Signed)
Friday is fine

## 2017-01-10 NOTE — Telephone Encounter (Signed)
Request for Letrozole refill. She did not show for her last appt and has been rescheduled for this Friday. Do you want to wait to see if she shows up Friday before refilling?

## 2017-01-13 NOTE — Progress Notes (Signed)
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  INTERVAL HISTORY: Patient returns to clinic today for routine 6 month evaluation. She continues to tolerate letrozole well without significant side effects. She was recently admitted to the hospital with a CHF exacerbation, but now feels back to her baseline. She currently feels well and is asymptomatic. She has no neurologic complaints. She denies any recent fevers. She has no chest pain, cough or shortness of breath. She denies any nausea, vomiting, constipation, or diarrhea. She has no urinary complaints. Patient offers no specific complaints today.  REVIEW OF SYSTEMS:   Review of Systems  Constitutional: Negative for fever, malaise/fatigue and weight loss.  Respiratory: Negative.  Negative for cough and shortness of breath.   Cardiovascular: Negative.  Negative for chest pain and leg swelling.  Gastrointestinal: Negative.  Negative for abdominal pain.  Genitourinary: Negative.   Musculoskeletal: Negative.   Neurological: Negative.  Negative for sensory change and weakness.  Psychiatric/Behavioral: Negative.  The patient is not nervous/anxious.     As per HPI. Otherwise, a complete review of systems is negative.  PAST MEDICAL HISTORY: Past Medical History:  Diagnosis Date  . Abnormal finding on mammography 11/21/2012   Overview:  Biopsied 11/25/2012 by Dr. Pat Patrick; was due for screening mammogram February 2015.   Marland Kitchen Acid reflux 03/31/2014  . Anemia    h/o  . Apnea, sleep 03/31/2014   Overview:  on CPAP   . B12 deficiency 04/01/2014  . BP (high blood pressure) 03/31/2014  . Breast cancer ([place])   . Cancer (HCC)    BREAST  . Cholelithiasis 05/27/2015   Requiring Cholecystectomy 08/14/2014- Dr. Pat Patrick   . Chronic LBP 05/02/2015  . Clinical depression 03/31/2014   Overview:  S/P suicide attempt 02/1995   . COPD (chronic obstructive pulmonary disease) ([place])    per cxr on 07-10-15  . Diabetes mellitus, type 2 ([place]) 03/31/2014  . Dysrhythmia    h/o fluttering occ  . HLD (hyperlipidemia) 03/31/2014  . Insomnia, persistent 04/01/2014  . Obesity 05/27/2015  . Personal history of radiation therapy   . 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 ABDOMINAL HYSTERECTOMY     Total  . BACK SURGERY     Multiple  . BREAST BIOPSY Right 06/20/09   neg  . BREAST BIOPSY Left 12/11/12   neg  . BREAST BIOPSY Left 06/04/2015  . BREAST LUMPECTOMY WITH SENTINEL LYMPH NODE BIOPSY Left 07/18/2015   Procedure: BREAST LUMPECTOMY WITH SENTINEL LYMPH NODE BX;  Surgeon: Hubbard Robinson, MD;  Location: [facility];  Service: General;  Laterality: Left;  . CARPAL TUNNEL RELEASE Right   . DEBRIDEMENT AND CLOSURE WOUND Left 10/01/2015   Procedure: DEBRIDEMENT AND CLOSURE WOUND;  Surgeon: Clayburn Pert, MD;  Location: [facility];  Service: General;  Laterality: Left;  . KNEE SURGERY Left   . LAPAROSCOPIC CHOLECYSTECTOMY  08/14/2010   Dr. Pat Patrick  . NECK SURGERY     Multiple  . SENTINEL NODE BIOPSY Left 07/18/2015   Procedure: SENTINEL NODE BIOPSY/NEEDLE LOC;  Surgeon: Hubbard Robinson, MD;  Location: [facility];  Service: General;  Laterality: Left;  . ULNAR NERVE REPAIR      FAMILY  HISTORY Family History  Problem Relation Age of Onset  . Hypertension    . Stroke    . Diabetes    . Heart disease    . Hypertension Mother   . Diabetes Brother   . Breast cancer Neg Hx        ADVANCED DIRECTIVES:    HEALTH MAINTENANCE: Social History  Substance Use Topics  . Smoking status: Former Smoker    Packs/day: 1.00    Years: 40.00    Types: Cigarettes    Quit date: 09/07/2015  . Smokeless tobacco: Never Used    . Alcohol use No     Colonoscopy:  PAP:  Bone density:  Lipid panel:  Allergies  Allergen Reactions  . Chlorthalidone Other (See Comments)  . Ciprofloxacin Swelling    Swelling of hands and feet  . Codeine Nausea Only  . Diclofenac     Other reaction(s): Abdominal Pain  . Hydrochlorothiazide     Other reaction(s): Other (See Comments) Leg cramps  . Adhesive [Tape] Rash    IF LEFT ON FOR LONG PERIODS-PAPER TAPE OK TO USE  . Betadine [Povidone Iodine] Rash    Current Outpatient Prescriptions  Medication Sig Dispense Refill  . apixaban (ELIQUIS) 5 MG TABS tablet Take 1 tablet (5 mg total) by mouth 2 (two) times daily. 60 tablet 0  . atorvastatin (LIPITOR) 20 MG tablet Take 1 tablet by mouth at bedtime.    Marland Kitchen CALCIUM PO Take 1 tablet by mouth daily.    . cetirizine (ZYRTEC) 10 MG tablet Take 1 tablet by mouth daily as needed.    . Cholecalciferol (D-5000) 5000 UNITS TABS Take 1 tablet by mouth daily.    . cyanocobalamin (V-R VITAMIN B-12) 500 MCG tablet Take 1 tablet by mouth daily.    Marland Kitchen diltiazem (CARDIZEM CD) 120 MG 24 hr capsule Take 1 capsule (120 mg total) by mouth daily. 30 capsule 0  . furosemide (LASIX) 40 MG tablet Take 1 tablet (40 mg total) by mouth daily. 30 tablet 0  . HYDROcodone-acetaminophen (NORCO/VICODIN) 5-325 MG tablet TK 1 T PO  Q 6 H PRF MODERATE PAIN  0  . insulin NPH-regular Human (NOVOLIN 70/30) (70-30) 100 UNIT/ML injection Inject 20-40 Units into the skin 2 (two) times daily. 20 units in am and up to 40 units qhs depending on her bs    . letrozole (FEMARA) 2.5 MG tablet Take 1 tablet (2.5 mg total) by mouth daily. 30 tablet 11  . meloxicam (MOBIC) 7.5 MG tablet Take by mouth.    . metoprolol (LOPRESSOR) 50 MG tablet Take 1 tablet (50 mg total) by mouth 2 (two) times daily. 60 tablet 0  . Multiple Vitamin (MULTIVITAMIN) capsule Take 1 capsule by mouth daily.    Marland Kitchen omeprazole (PRILOSEC) 20 MG capsule Take 1 capsule by mouth every morning.     . traZODone  (DESYREL) 150 MG tablet Take 1 tablet by mouth at bedtime.     No current facility-administered medications for this visit.     OBJECTIVE: Vitals:   01/14/17 1202  BP: 107/69  Pulse: 60  Resp: 18  Temp: 98.1 F (36.7 C)     Body mass index is 34.5 kg/m.    ECOG FS:0 - Asymptomatic  General: Well-developed, well-nourished, no acute distress. Eyes: Pink conjunctiva, anicteric sclera. Breasts: Left breast with no palpable lumps or masses. Exam recently performed by another provider.  Lungs: Clear to auscultation bilaterally. Heart: Regular rate and rhythm. No rubs, murmurs, or gallops.  Abdomen: Soft, nontender, nondistended. No organomegaly noted, normoactive bowel sounds. Musculoskeletal: No edema, cyanosis, or clubbing. Neuro: Alert, answering all questions appropriately. Cranial nerves grossly intact. Skin: No rashes or petechiae noted. Psych: Normal affect.   LAB RESULTS:  Lab Results  Component Value Date   NA 141 12/30/2016   K 3.7 12/30/2016   CL 102 12/30/2016   CO2 32 12/30/2016   GLUCOSE 96 12/30/2016   BUN 25 (H) 12/30/2016   CREATININE 0.87 12/30/2016   CALCIUM 8.9 12/30/2016   PROT 7.3 07/10/2015   ALBUMIN 3.6 07/10/2015   AST 17 07/10/2015   ALT 16 07/10/2015   ALKPHOS 62 07/10/2015   BILITOT 0.6 07/10/2015   GFRNONAA >60 12/30/2016   GFRAA >60 12/30/2016    Lab Results  Component Value Date   WBC 11.3 (H) 12/31/2016   NEUTROABS 9.7 (H) 12/27/2016   HGB 13.5 12/31/2016   HCT 40.5 12/31/2016   MCV 96.0 12/31/2016   PLT 226 12/31/2016     STUDIES: Dg Chest 2 View  Result Date: 12/27/2016 CLINICAL DATA:  Intermittent shortness of breath for 1 week, COPD EXAM: CHEST  2 VIEW COMPARISON:  07/10/2015 FINDINGS: Cardiomediastinal silhouette is stable. Hyperinflation is noted. Bilateral reticular interstitial prominence and peripheral fibrotic changes with slight worsening from prior exam especially in lower lobes. No definite superimposed infiltrate or  pulmonary edema. Trace bilateral pleural effusion. Osteopenia and degenerative changes thoracic spine. Stable compression deformity lower thoracic spine. IMPRESSION: Hyperinflation is noted. Bilateral reticular interstitial prominence and peripheral fibrotic changes with slight worsening from prior exam especially in lower lobes. No definite superimposed infiltrate or pulmonary edema. Trace bilateral pleural effusion. Electronically Signed   By: Lahoma Crocker M.D.   On: 12/27/2016 09:12   Ct Angio Chest Pe W And/or Wo Contrast  Result Date: 12/27/2016 CLINICAL DATA:  Increase shortness of breath with exertion, elevated blood pressure over the last several months, some swelling of the ankles EXAM: CT ANGIOGRAPHY CHEST WITH CONTRAST TECHNIQUE: Multidetector CT imaging of the chest was performed using the standard protocol during bolus administration of intravenous contrast. Multiplanar CT image reconstructions and MIPs were obtained to evaluate the vascular anatomy. CONTRAST:  75 cc Isovue 370 COMPARISON:  Chest x-ray of 12/27/2016 FINDINGS: Cardiovascular: The pulmonary arteries are well opacified. There is no evidence of acute pulmonary embolism. There is partial opacification of the thoracic aorta with no acute abnormality noted. Moderate thoracic aortic atherosclerosis is present. The mid ascending thoracic aorta measures 30 mm in diameter. Coronary artery calcifications are noted. Moderate cardiomegaly is present. Mediastinum/Nodes: No mediastinal or hilar adenopathy is seen with only small lymph nodes present. Lungs/Pleura: Chronic interstitial changes are present anteriorly within the upper lobes. There are small to moderate size bilateral pleural effusions present right slightly larger than left. Opacification of a portion of the right lower lobe may be due to atelectasis, but pneumonia cannot be excluded. There are prominent interstitial markings at the lung bases is well and interstitial edema is a definite  consideration. Upper Abdomen: On the few images through the upper abdomen there is suggestion of left adrenal enlargement which could be due to adenoma but cannot be assessed well on this CT of the chest. Musculoskeletal: There are degenerative changes in the mid to lower thoracic spine. Review of the MIP images confirms the above findings. IMPRESSION: 1. No evidence of acute pulmonary embolism. 2. Moderate thoracic aortic atherosclerosis. 3. Coronary artery calcifications and moderate cardiomegaly. 4. Prominent interstitial markings with bilateral pleural effusions which may  indicate interstitial edema. Also, pneumonia cannot be excluded at the right lung base. 5. Suspect enlargement of left adrenal gland, not well visualized on this CT of the chest. Possible incidental adrenal adenoma. Electronically Signed   By: Ivar Drape M.D.   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  PLAN:    1. 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 Patient had low risk Oncotype score, therefore did not require adjuvant chemotherapy. Continue letrozole daily for 5 years completing in March 2022. Her most recent mammogram on June 02, 2016 was reported as BI-RADS 2, repeat in September 2018. Return to clinic in 6 months for routine evaluation. 2. Osteopenia: Bone marrow density on April 12, 2016 reported T score of -1.1. Repeat in September 2018 on the same day as her mammogram.  Patient will return to clinic Patient expressed understanding and was in agreement with this plan. She also understands that She can call clinic at any time with any questions, concerns, or complaints.    Lloyd Huger, MD   01/15/2017 1:43 PM

## 2017-01-14 ENCOUNTER — Inpatient Hospital Stay (HOSPITAL_BASED_OUTPATIENT_CLINIC_OR_DEPARTMENT_OTHER): Payer: Medicare Other | Admitting: Oncology

## 2017-01-14 VITALS — BP 107/69 | HR 60 | Temp 98.1°F | Resp 18 | Wt 213.7 lb

## 2017-01-14 DIAGNOSIS — J449 Chronic obstructive pulmonary disease, unspecified: Secondary | ICD-10-CM | POA: Diagnosis not present

## 2017-01-14 DIAGNOSIS — I499 Cardiac arrhythmia, unspecified: Secondary | ICD-10-CM | POA: Diagnosis not present

## 2017-01-14 DIAGNOSIS — M8580 Other specified disorders of bone density and structure, unspecified site: Secondary | ICD-10-CM | POA: Diagnosis not present

## 2017-01-14 DIAGNOSIS — G4730 Sleep apnea, unspecified: Secondary | ICD-10-CM

## 2017-01-14 DIAGNOSIS — E538 Deficiency of other specified B group vitamins: Secondary | ICD-10-CM | POA: Diagnosis not present

## 2017-01-14 DIAGNOSIS — F329 Major depressive disorder, single episode, unspecified: Secondary | ICD-10-CM

## 2017-01-14 DIAGNOSIS — K219 Gastro-esophageal reflux disease without esophagitis: Secondary | ICD-10-CM | POA: Diagnosis not present

## 2017-01-14 DIAGNOSIS — Z794 Long term (current) use of insulin: Secondary | ICD-10-CM | POA: Diagnosis not present

## 2017-01-14 DIAGNOSIS — E669 Obesity, unspecified: Secondary | ICD-10-CM | POA: Diagnosis not present

## 2017-01-14 DIAGNOSIS — Z87891 Personal history of nicotine dependence: Secondary | ICD-10-CM | POA: Diagnosis not present

## 2017-01-14 DIAGNOSIS — Z87442 Personal history of urinary calculi: Secondary | ICD-10-CM | POA: Diagnosis not present

## 2017-01-14 DIAGNOSIS — D649 Anemia, unspecified: Secondary | ICD-10-CM

## 2017-01-14 DIAGNOSIS — Z170 Estrogen receptor positive status [ER+]: Secondary | ICD-10-CM

## 2017-01-14 DIAGNOSIS — Z79811 Long term (current) use of aromatase inhibitors: Secondary | ICD-10-CM | POA: Diagnosis not present

## 2017-01-14 DIAGNOSIS — I2510 Atherosclerotic heart disease of native coronary artery without angina pectoris: Secondary | ICD-10-CM

## 2017-01-14 DIAGNOSIS — Z79899 Other long term (current) drug therapy: Secondary | ICD-10-CM | POA: Diagnosis not present

## 2017-01-14 DIAGNOSIS — E785 Hyperlipidemia, unspecified: Secondary | ICD-10-CM

## 2017-01-14 DIAGNOSIS — I509 Heart failure, unspecified: Secondary | ICD-10-CM

## 2017-01-14 DIAGNOSIS — C50212 Malignant neoplasm of upper-inner quadrant of left female breast: Secondary | ICD-10-CM

## 2017-01-14 DIAGNOSIS — E119 Type 2 diabetes mellitus without complications: Secondary | ICD-10-CM

## 2017-01-14 DIAGNOSIS — Z780 Asymptomatic menopausal state: Secondary | ICD-10-CM

## 2017-01-14 DIAGNOSIS — G4700 Insomnia, unspecified: Secondary | ICD-10-CM | POA: Diagnosis not present

## 2017-01-14 DIAGNOSIS — Z923 Personal history of irradiation: Secondary | ICD-10-CM | POA: Diagnosis not present

## 2017-01-14 DIAGNOSIS — I700 Atherosclerosis of aorta: Secondary | ICD-10-CM

## 2017-01-14 MED ORDER — LETROZOLE 2.5 MG PO TABS: 2.5000 mg | ORAL_TABLET | Freq: Every day | ORAL | 11 refills | Status: DC

## 2017-02-02 ENCOUNTER — Other Ambulatory Visit: Payer: Self-pay | Admitting: Cardiology

## 2017-02-03 ENCOUNTER — Ambulatory Visit
Admission: RE | Admit: 2017-02-03 | Discharge: 2017-02-03 | Disposition: A | Discharge status: Home / Self Care | Payer: Medicare Other | Source: Ambulatory Visit | Attending: Cardiology | Admitting: Cardiology

## 2017-02-03 ENCOUNTER — Encounter
Admission: RE | Disposition: A | Discharge status: Home / Self Care | Payer: Self-pay | Source: Ambulatory Visit | Attending: Cardiology

## 2017-02-03 ENCOUNTER — Ambulatory Visit: Payer: Medicare Other | Admitting: Anesthesiology

## 2017-02-03 ENCOUNTER — Encounter: Payer: Self-pay | Admitting: *Deleted

## 2017-02-03 DIAGNOSIS — Z79899 Other long term (current) drug therapy: Secondary | ICD-10-CM | POA: Diagnosis not present

## 2017-02-03 DIAGNOSIS — Z7901 Long term (current) use of anticoagulants: Secondary | ICD-10-CM | POA: Diagnosis not present

## 2017-02-03 DIAGNOSIS — E782 Mixed hyperlipidemia: Secondary | ICD-10-CM | POA: Insufficient documentation

## 2017-02-03 DIAGNOSIS — I10 Essential (primary) hypertension: Secondary | ICD-10-CM | POA: Insufficient documentation

## 2017-02-03 DIAGNOSIS — Z87891 Personal history of nicotine dependence: Secondary | ICD-10-CM | POA: Insufficient documentation

## 2017-02-03 DIAGNOSIS — E119 Type 2 diabetes mellitus without complications: Secondary | ICD-10-CM | POA: Insufficient documentation

## 2017-02-03 DIAGNOSIS — K219 Gastro-esophageal reflux disease without esophagitis: Secondary | ICD-10-CM | POA: Diagnosis not present

## 2017-02-03 DIAGNOSIS — G4700 Insomnia, unspecified: Secondary | ICD-10-CM | POA: Diagnosis not present

## 2017-02-03 DIAGNOSIS — Z853 Personal history of malignant neoplasm of breast: Secondary | ICD-10-CM | POA: Diagnosis not present

## 2017-02-03 DIAGNOSIS — G4733 Obstructive sleep apnea (adult) (pediatric): Secondary | ICD-10-CM | POA: Insufficient documentation

## 2017-02-03 DIAGNOSIS — I481 Persistent atrial fibrillation: Secondary | ICD-10-CM | POA: Diagnosis present

## 2017-02-03 DIAGNOSIS — F329 Major depressive disorder, single episode, unspecified: Secondary | ICD-10-CM | POA: Insufficient documentation

## 2017-02-03 DIAGNOSIS — Z794 Long term (current) use of insulin: Secondary | ICD-10-CM | POA: Insufficient documentation

## 2017-02-03 HISTORY — PX: CARDIOVERSION: EP1203

## 2017-02-03 SURGERY — CARDIOVERSION (CATH LAB)
Anesthesia: General

## 2017-02-03 SURGERY — CARDIOVERSION (CATH LAB)
Anesthesia: Choice

## 2017-02-03 MED ORDER — SODIUM CHLORIDE 0.9 % IV SOLN
INTRAVENOUS | Status: DC | PRN
  Administered 2017-02-03: 07:00:00 via INTRAVENOUS

## 2017-02-03 MED ORDER — PROPOFOL 10 MG/ML IV BOLUS
INTRAVENOUS | Status: DC | PRN
  Administered 2017-02-03: 40 mg via INTRAVENOUS

## 2017-02-03 MED ORDER — PROPOFOL 10 MG/ML IV BOLUS
INTRAVENOUS | Status: AC
  Filled 2017-02-03: qty 40

## 2017-02-03 MED ORDER — MIDAZOLAM HCL 2 MG/2ML IJ SOLN
INTRAMUSCULAR | Status: AC
  Filled 2017-02-03: qty 2

## 2017-02-03 MED ORDER — LIDOCAINE HCL 2 % IJ SOLN
INTRAMUSCULAR | Status: AC
  Filled 2017-02-03: qty 10

## 2017-02-03 MED ORDER — FENTANYL CITRATE (PF) 100 MCG/2ML IJ SOLN
INTRAMUSCULAR | Status: AC
  Filled 2017-02-03: qty 2

## 2017-02-03 NOTE — Anesthesia Post-op Follow-up Note (Cosign Needed)
Anesthesia QCDR form completed.        

## 2017-02-03 NOTE — H&P (Signed)
Chief Complaint: Chief Complaint  Patient presents with  . Follow-up  3 weeks wore a holter  . Palpitations  I have had a problem with them but okay today  . other  has seen Dr Argentina Ponder got an injection  Date of Service: 01/27/2017 Date of Birth: May 14, 1946 PCP: DAVID Raeanne Barry, MD  History of Present Illness: Ms. Hammac is a 71 y.o.female patient who presents for a follow-up visit after being seen in the hospital. Had an episode of shortness of breath prompting presentation to the emergency room. She was noted to be mildly hypoxic with possible mild volume overload. She was admitted and developed atrial fibrillation during the hospitalization. She was placed on Cardizem and metoprolol for rate control and placed on Eliquis for anticoagulation. She improved and was discharged. She underwent electrocardiogram today in follow-up showing an EKG showing atrial fibrillation at a rate of 86 bpm with a QRS duration of 68 ms, QTC of 426 ms with a QRS axis of 49. She remains in atrial fibrillation with good rate control. She is symptomatic from this. We discussed risks and benefits of cardioversion and she would like to proceed. She is tolerating chronic anticoagulation well and she states she has not missed any doses. Holter monitor revealed persistent atrial fibrillation. There was no sinus rhythm. Past Medical and Surgical History  Past Medical History Past Medical History:  Diagnosis Date  . Abnormal mammogram of left breast 11/21/2012  Dr. Pat Patrick  . Breast cancer , unspecified (CMS-HCC)  left  . Chronic insomnia  . Depression, unspecified  S/P suicide attempt 02/1995  . Diabetes mellitus type 2, uncomplicated (CMS-HCC)  . Fracture of patella, left, closed  03/2012  . Fracture of pelvis, closed (CMS-HCC)  . Fracture of radial head, left, closed 08/17/2013  normal BMD 01/07/2014  . GERD (gastroesophageal reflux disease)  . Hyperlipidemia, unspecified  . Hypertension  . Leukocytosis   evaluated by Dr. Ma Hillock  . Sleep apnea  on CPAP   Past Surgical History She has a past surgical history that includes Cholecystectomy (07/2010); Endoscopic Carpal Tunnel Release (Right, 02/2005); Breast excisional biopsy (Right, 05/2009); Open Reduction Patellar Fracture W/Int Fixation (Left, 03/2012); back surgery (x 5); neck surgery (x 2); Vaginal hysterectomy (late 1990's); cystopexy (1990's); ulnar nerve decompression (Left, ca 2008); Percutaneous Biopsy Breast (Right, 2015); breast lumpectomy (Left, 07/18/2015); and Incision and drainage breast abscess (Left, 10/01/2015).   Medications and Allergies  Current Medications  Current Outpatient Prescriptions  Medication Sig Dispense Refill  . amLODIPine (NORVASC) 10 MG tablet Take 1 tablet (10 mg total) by mouth once daily. for Blood Pressure 30 tablet prn  . apixaban (ELIQUIS) 5 mg tablet Take 1 tablet (5 mg total) by mouth 2 (two) times daily. 60 tablet 11  . atorvastatin (LIPITOR) 20 MG tablet Take 1 tablet (20 mg total) by mouth once daily. For cholesterol 30 tablet prn  . calcium polycarbophil 500 mg Chew Take by mouth.  . cetirizine (ZYRTEC) 10 MG tablet Take 10 mg by mouth once daily as needed.  . cholecalciferol, vitamin D3, (VITAMIN D3) 5,000 unit Tab Take 1 tablet by mouth once daily.  . cyanocobalamin (VITAMIN B12) 500 MCG tablet Take 500 mcg by mouth once daily.  Marland Kitchen diltiazem (CARDIZEM CD) 120 MG XR capsule Take 1 capsule (120 mg total) by mouth once daily. 30 capsule 11  . FUROsemide (LASIX) 40 MG tablet Take 1 tablet (40 mg total) by mouth every morning. For fluid 30 tablet 3  . HUMULIN 70/30  100 unit/mL (70-30) injection INJECT 25 UNITS IN THE MORNING AND 40 IN THE EVENING AS DIRECTED (Patient taking differently: INJECT 20 UNITS IN THE MORNING AND 40 IN THE EVENING AS DIRECTED) 60 mL 3  . letrozole (FEMARA) 2.5 mg tablet Take 2.5 mg by mouth once daily.   . meloxicam (MOBIC) 7.5 MG tablet Take 1 tablet (7.5 mg total) by mouth  once daily. For arthritis pain, in place of naproxen 30 tablet 4  . metoprolol tartrate (LOPRESSOR) 50 MG tablet Take 1 tablet (50 mg total) by mouth 2 (two) times daily. 60 tablet 11  . multivitamin capsule Take 1 capsule by mouth once daily.   . naproxen sodium (ALEVE, ANAPROX) 220 MG tablet Take 220 mg by mouth 2 (two) times daily as needed for Pain.   Marland Kitchen omeprazole (PRILOSEC) 20 MG DR capsule Take 1 capsule (20 mg total) by mouth once daily. For reflux 30 capsule prn  . traZODone (DESYREL) 150 MG tablet Take 1 tablet (150 mg total) by mouth nightly. For insomnia 30 tablet prn   No current facility-administered medications for this visit.   Allergies: Chlorthalidone; Ciprofloxacin; Codeine; Diclofenac; Hydrochlorothiazide; Adhesive; and Povidone-iodine  Social and Family History  Social History reports that she quit smoking about 8 months ago. Her smoking use included Cigarettes. She smoked 0.50 packs per day. She has never used smokeless tobacco. She reports that she does not drink alcohol.  Family History Family History  Problem Relation Age of Onset  . Dementia Mother  . Stroke Father  . Asthma Paternal Aunt  . Heart failure Brother  . Stroke Brother  . Breast cancer Neg Hx  . Colon cancer Neg Hx   Review of Systems  Review of Systems  Constitutional: Negative for chills, diaphoresis, fever, malaise/fatigue and weight loss.  HENT: Negative for congestion, ear discharge, hearing loss and tinnitus.  Eyes: Negative for blurred vision.  Respiratory: Positive for shortness of breath. Negative for cough, hemoptysis, sputum production and wheezing.  Cardiovascular: Negative for chest pain, palpitations, orthopnea, claudication, leg swelling and PND.  Gastrointestinal: Negative for abdominal pain, blood in stool, constipation, diarrhea, heartburn, melena, nausea and vomiting.  Genitourinary: Negative for dysuria, frequency, hematuria and urgency.  Musculoskeletal: Negative for back  pain, falls, joint pain and myalgias.  Skin: Negative for itching and rash.  Neurological: Negative for dizziness, tingling, focal weakness, loss of consciousness, weakness and headaches.  Endo/Heme/Allergies: Negative for polydipsia. Does not bruise/bleed easily.  Psychiatric/Behavioral: Negative for depression, memory loss and substance abuse. The patient is not nervous/anxious.   Physical Examination   Vitals:BP 140/72 (BP Location: Right upper arm, Patient Position: Sitting, BP Cuff Size: Adult)  Pulse 64  Resp 12  Ht 168.9 cm (5' 6.5")  Wt 95.6 kg (210 lb 12.8 oz)  LMP (LMP Unknown)  BMI 33.51 kg/m  Ht:168.9 cm (5' 6.5") Wt:95.6 kg (210 lb 12.8 oz) OBS:JGGE surface area is 2.12 meters squared. Body mass index is 33.51 kg/m.  Wt Readings from Last 3 Encounters:  01/27/17 95.6 kg (210 lb 12.8 oz)  01/24/17 97.1 kg (214 lb)  01/11/17 97.1 kg (214 lb)   BP Readings from Last 3 Encounters:  01/27/17 140/72  01/24/17 132/72  01/11/17 142/70   General appearance appears in no acute distress  Head Mouth and Eye exam Normocephalic, without obvious abnormality, atraumatic Dentition is good Eyes appear anicteric   Neck exam Thyroid: normal  Nodes: no obvious adenopathy  LUNGS Breath Sounds: Normal Percussion: Normal  CARDIOVASCULAR JVP CV wave:  no HJR: no Elevation at 90 degrees: None Carotid Pulse: normal pulsation bilaterally Bruit: None Apex: apical impulse normal  Auscultation Rhythm: atrial fibrillation S1: normal S2: normal Clicks: no Rub: no Murmurs: no murmurs  Gallop: None ABDOMEN Liver enlargement: no Pulsatile aorta: no Ascites: no Bruits: no  EXTREMITIES Clubbing: no Edema: trace to 1+ bilateral pedal edema Pulses: peripheral pulses symmetrical Femoral Bruits: no Amputation: no SKIN Rash: no Cyanosis: no Embolic phemonenon: no Bruising: no NEURO Alert and Oriented to person, place and time: yes Non focal: yes  PSYCH: Pt  appears to have normal affect  LABS REVIEWED Last 3 CBC results: Lab Results  Component Value Date  WBC 16.8 (H) 01/27/2017  WBC 12.8 (H) 01/11/2017  WBC 13.5 (H) 11/16/2016   Lab Results  Component Value Date  HGB 14.4 01/27/2017  HGB 14.4 01/11/2017  HGB 14.6 11/16/2016   Lab Results  Component Value Date  HCT 43.4 01/27/2017  HCT 44.3 01/11/2017  HCT 44.9 11/16/2016   Lab Results  Component Value Date  PLT 257 01/27/2017  PLT 227 01/11/2017  PLT 247 11/16/2016   Lab Results  Component Value Date  CREATININE 0.9 01/27/2017  BUN 27 (H) 01/27/2017  NA 141 01/27/2017  K 4.3 01/27/2017  CL 104 01/27/2017  CO2 27.0 01/27/2017   Lab Results  Component Value Date  HGBA1C 6.0 (H) 11/16/2016   Lab Results  Component Value Date  HDL 57.9 11/16/2016  HDL 53.6 05/14/2016  HDL 42.1 11/17/2015   Lab Results  Component Value Date  LDLCALC 81 11/16/2016  LDLCALC 112 05/14/2016  LDLCALC 89 11/17/2015   Lab Results  Component Value Date  TRIG 86 11/16/2016  TRIG 86 05/14/2016  TRIG 127 11/17/2015   Lab Results  Component Value Date  ALT 13 11/16/2016  AST 12 11/16/2016  ALKPHOS 72 05/14/2016   Lab Results  Component Value Date  TSH 2.84 01/10/2006   Diagnostic Studies Reviewed:  EKG EKG demonstrated atrial fibrillation, rate 86.  Assessment and Plan   71 y.o. female with  ICD-10-CM ICD-9-CM  1. Atrial fibrillation, unspecified type-atrial fibrillation persists. Good rate control at present with Cardizem and metoprolol. Will continue with this regimen and follow. Will continue with anticoagulation and follow for bleeding. No evidence of sinus rhythm. No contraindication to cardioversion at present. Patient will have at least 4 weeks of chronic anticoagulation missing no doses. We will proceed with cardioversion with further evaluation recommendations after this is completed. Weight loss is recommended. Avoiding stimulants is recommended. I48.91 427.31 ECG  12-lead     2. Mixed hyperlipidemia-continue with atorvastatin at 20 mg daily with an LDL goal of less than 100. Current level is 81. E78.2 272.2  3. Essential hypertension-continue with metoprolol and Cardizem as well as amlodipine. I10 401.9  4. Obstructive sleep apnea syndrome-weight loss and continued use of CPAP G47.33 327.23   Return in about 2 weeks (around 02/10/2017).  These notes generated with voice recognition software. I apologize for typographical errors.  Sydnee Levans, MD    Pt seen and examined. No changes from above.

## 2017-02-03 NOTE — Transfer of Care (Signed)
Immediate Anesthesia Transfer of Care Note  Patient: Robin Huff  Procedure(s) Performed: Procedure(s): CARDIOVERSION (N/A)  Patient Location: PACU  Anesthesia Type:General  Level of Consciousness: awake, alert , oriented and patient cooperative  Airway & Oxygen Therapy: Patient Spontanous Breathing and Patient connected to nasal cannula oxygen  Post-op Assessment: Report given to RN, Post -op Vital signs reviewed and stable and Patient moving all extremities X 4  Post vital signs: Reviewed and stable  Last Vitals:  Vitals:   02/03/17 0732 02/03/17 0733  BP:  102/70  Pulse: (!) 58 60  Resp: (!) 21   Temp:  36.7 C    Last Pain:  Vitals:   02/03/17 0733  TempSrc: Oral  PainSc: 0-No pain         Complications: No apparent anesthesia complications

## 2017-02-03 NOTE — Procedures (Signed)
Electrical Cardioversion Procedure Note Robin Huff [id number] 1946-07-02  Procedure: Electrical Cardioversion Indications:  Atrial Fibrillation  Procedure Details Consent: Risks of procedure as well as the alternatives and risks of each were explained to the (patient/caregiver).  Consent for procedure obtained. Time Out: Verified patient identification, verified procedure, site/side was marked, verified correct patient position, special equipment/implants available, medications/allergies/relevent history reviewed, required imaging and test results available.  Performed  Patient placed on cardiac monitor, pulse oximetry, supplemental oxygen as necessary.  Sedation given: propofol Pacer pads placed anterior and posterior chest.  Cardioverted 1 time(s).  Cardioverted at 120J.  Evaluation Findings: Post procedure EKG shows: NSR Complications: None Patient did tolerate procedure well.   Teodoro Spray 02/03/2017, 7:31 AM

## 2017-02-03 NOTE — Anesthesia Postprocedure Evaluation (Signed)
Anesthesia Post Note  Patient: Robin Huff  Procedure(s) Performed: Procedure(s) (LRB): CARDIOVERSION (N/A)  Patient location during evaluation: Cath Lab Anesthesia Type: General Level of consciousness: awake, awake and alert, oriented and patient cooperative Pain management: satisfactory to patient Vital Signs Assessment: post-procedure vital signs reviewed and stable Respiratory status: spontaneous breathing Cardiovascular status: stable Anesthetic complications: no     Last Vitals:  Vitals:   02/03/17 0732 02/03/17 0733  BP:  102/70  Pulse: (!) 58 60  Resp: (!) 21   Temp:  36.7 C    Last Pain:  Vitals:   02/03/17 0733  TempSrc: Oral  PainSc: 0-No pain                 Silvana Newness A

## 2017-02-03 NOTE — Anesthesia Preprocedure Evaluation (Signed)
Anesthesia Evaluation  Patient identified by MRN, date of birth, ID band Patient awake    Reviewed: Allergy & Precautions, NPO status , Patient's Chart, lab work & pertinent test results, reviewed documented beta blocker date and time   Airway Mallampati: III  TM Distance: >3 FB     Dental  (+) Chipped   Pulmonary sleep apnea , COPD, former smoker,           Cardiovascular hypertension, Pt. on medications and Pt. on home beta blockers +CHF  + dysrhythmias Atrial Fibrillation      Neuro/Psych PSYCHIATRIC DISORDERS Depression  Neuromuscular disease    GI/Hepatic GERD  Controlled,  Endo/Other  diabetes, Type 2  Renal/GU      Musculoskeletal   Abdominal   Peds  Hematology  (+) anemia ,   Anesthesia Other Findings   Reproductive/Obstetrics                             Anesthesia Physical Anesthesia Plan  ASA: III  Anesthesia Plan: General   Post-op Pain Management:    Induction: Intravenous  Airway Management Planned:   Additional Equipment:   Intra-op Plan:   Post-operative Plan:   Informed Consent: I have reviewed the patients History and Physical, chart, labs and discussed the procedure including the risks, benefits and alternatives for the proposed anesthesia with the patient or authorized representative who has indicated his/her understanding and acceptance.     Plan Discussed with: CRNA  Anesthesia Plan Comments:         Anesthesia Quick Evaluation

## 2017-02-04 ENCOUNTER — Encounter: Payer: Self-pay | Admitting: Cardiology

## 2017-02-07 ENCOUNTER — Encounter: Payer: Self-pay | Admitting: Family

## 2017-02-07 ENCOUNTER — Ambulatory Visit: Payer: Medicare Other | Attending: Family | Admitting: Family

## 2017-02-07 VITALS — BP 121/56 | HR 52 | Resp 20 | Ht 66.0 in | Wt 210.4 lb

## 2017-02-07 DIAGNOSIS — I5032 Chronic diastolic (congestive) heart failure: Secondary | ICD-10-CM | POA: Diagnosis present

## 2017-02-07 DIAGNOSIS — Z888 Allergy status to other drugs, medicaments and biological substances status: Secondary | ICD-10-CM | POA: Diagnosis not present

## 2017-02-07 DIAGNOSIS — Z9889 Other specified postprocedural states: Secondary | ICD-10-CM | POA: Diagnosis not present

## 2017-02-07 DIAGNOSIS — F329 Major depressive disorder, single episode, unspecified: Secondary | ICD-10-CM | POA: Diagnosis not present

## 2017-02-07 DIAGNOSIS — Z803 Family history of malignant neoplasm of breast: Secondary | ICD-10-CM | POA: Diagnosis not present

## 2017-02-07 DIAGNOSIS — Z823 Family history of stroke: Secondary | ICD-10-CM | POA: Diagnosis not present

## 2017-02-07 DIAGNOSIS — I4891 Unspecified atrial fibrillation: Secondary | ICD-10-CM | POA: Diagnosis not present

## 2017-02-07 DIAGNOSIS — Z79899 Other long term (current) drug therapy: Secondary | ICD-10-CM | POA: Diagnosis not present

## 2017-02-07 DIAGNOSIS — I110 Hypertensive heart disease with heart failure: Secondary | ICD-10-CM | POA: Insufficient documentation

## 2017-02-07 DIAGNOSIS — G4733 Obstructive sleep apnea (adult) (pediatric): Secondary | ICD-10-CM | POA: Insufficient documentation

## 2017-02-07 DIAGNOSIS — E669 Obesity, unspecified: Secondary | ICD-10-CM | POA: Insufficient documentation

## 2017-02-07 DIAGNOSIS — G4700 Insomnia, unspecified: Secondary | ICD-10-CM | POA: Diagnosis not present

## 2017-02-07 DIAGNOSIS — Z853 Personal history of malignant neoplasm of breast: Secondary | ICD-10-CM | POA: Insufficient documentation

## 2017-02-07 DIAGNOSIS — I480 Paroxysmal atrial fibrillation: Secondary | ICD-10-CM

## 2017-02-07 DIAGNOSIS — D649 Anemia, unspecified: Secondary | ICD-10-CM | POA: Diagnosis not present

## 2017-02-07 DIAGNOSIS — Z87891 Personal history of nicotine dependence: Secondary | ICD-10-CM | POA: Insufficient documentation

## 2017-02-07 DIAGNOSIS — E119 Type 2 diabetes mellitus without complications: Secondary | ICD-10-CM | POA: Diagnosis not present

## 2017-02-07 DIAGNOSIS — J449 Chronic obstructive pulmonary disease, unspecified: Secondary | ICD-10-CM | POA: Diagnosis not present

## 2017-02-07 DIAGNOSIS — K219 Gastro-esophageal reflux disease without esophagitis: Secondary | ICD-10-CM | POA: Insufficient documentation

## 2017-02-07 DIAGNOSIS — E538 Deficiency of other specified B group vitamins: Secondary | ICD-10-CM | POA: Insufficient documentation

## 2017-02-07 DIAGNOSIS — Z794 Long term (current) use of insulin: Secondary | ICD-10-CM | POA: Insufficient documentation

## 2017-02-07 DIAGNOSIS — Z8249 Family history of ischemic heart disease and other diseases of the circulatory system: Secondary | ICD-10-CM | POA: Diagnosis not present

## 2017-02-07 DIAGNOSIS — E785 Hyperlipidemia, unspecified: Secondary | ICD-10-CM | POA: Diagnosis not present

## 2017-02-07 DIAGNOSIS — Z833 Family history of diabetes mellitus: Secondary | ICD-10-CM | POA: Diagnosis not present

## 2017-02-07 NOTE — Progress Notes (Signed)
Patient ID: Robin Huff, female    DOB: 1946/09/07, 71 y.o.   MRN: [id number]  HPI  Ms Robin Huff is a 71 y/o female with a history of GERD, anemia, obstructive sleep apnea (CPAP), B12 deficiency, HTN, depression, COPD, diabetes, atrial fibrillation, hyperlipidemia, remote tobacco use and chronic heart failure.   Reviewed last echo which was done 12/28/16 and it showed an EF of 60-65% along with mild MR/TR.   Had cardioversion done 5/10/8 and discharged the same day. Admitted 12/27/16 with AF with RVR along with HF exacerbation. Cardiology consult obtained and medications were adjusted. Given IV diuretics initially. Discharged home after 4 days.   She presents today with a chief complaint of a follow-up visit. She currently denies any shortness of breath, chest pain, fatigue, edema or weight gain. Reports feeling well since her cardioversion.   Past Medical History:  Diagnosis Date  . Abnormal finding on mammography 11/21/2012   Overview:  Biopsied 11/25/2012 by Dr. Pat Patrick; was due for screening mammogram February 2015.   Marland Kitchen Acid reflux 03/31/2014  . Anemia    h/o  . Apnea, sleep 03/31/2014   Overview:  on CPAP   . B12 deficiency 04/01/2014  . BP (high blood pressure) 03/31/2014  . Breast cancer ([place])   . Cancer (HCC)    BREAST  . Cholelithiasis 05/27/2015   Requiring Cholecystectomy 08/14/2014- Dr. Pat Patrick   . Chronic LBP 05/02/2015  . Clinical depression 03/31/2014   Overview:  S/P suicide attempt 02/1995   . COPD (chronic obstructive pulmonary disease) ([place])    per cxr on 07-10-15  . Diabetes mellitus, type 2 ([place]) 03/31/2014  . Dysrhythmia    h/o fluttering occ  . HLD (hyperlipidemia) 03/31/2014  . Insomnia, persistent 04/01/2014  . Obesity 05/27/2015  . Personal history of radiation therapy   . 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 ABDOMINAL HYSTERECTOMY     Total  . BACK SURGERY     Multiple  . BREAST BIOPSY Right 06/20/09   neg  . BREAST BIOPSY  Left 12/11/12   neg  . BREAST BIOPSY Left 06/04/2015  . BREAST LUMPECTOMY WITH SENTINEL LYMPH NODE BIOPSY Left 07/18/2015   Procedure: BREAST LUMPECTOMY WITH SENTINEL LYMPH NODE BX;  Surgeon: Hubbard Robinson, MD;  Location: [facility] ORS;  Service: General;  Laterality: Left;  . CARDIOVERSION N/A 02/03/2017   Procedure: CARDIOVERSION;  Surgeon: Teodoro Spray, MD;  Location: [facility] ORS;  Service: Cardiovascular;  Laterality: N/A;  . CARPAL TUNNEL RELEASE Right   . DEBRIDEMENT AND CLOSURE WOUND Left 10/01/2015   Procedure: DEBRIDEMENT AND CLOSURE WOUND;  Surgeon: Clayburn Pert, MD;  Location: [facility] ORS;  Service: General;  Laterality: Left;  . KNEE SURGERY Left   . LAPAROSCOPIC CHOLECYSTECTOMY  08/14/2010   Dr. Pat Patrick  . NECK SURGERY     Multiple  . SENTINEL NODE BIOPSY Left 07/18/2015   Procedure: SENTINEL NODE BIOPSY/NEEDLE LOC;  Surgeon: Hubbard Robinson, MD;  Location: [facility] ORS;  Service: General;  Laterality: Left;  . ULNAR NERVE REPAIR     Family History  Problem Relation Age of Onset  . Hypertension Unknown   . Stroke Unknown   . Diabetes Unknown   . Heart disease Unknown   . Hypertension Mother   . Diabetes Brother   . Breast cancer Neg Hx    Social History  Substance Use Topics  . Smoking status: Former Smoker    Packs/day: 1.00  Years: 40.00    Types: Cigarettes    Quit date: 09/07/2015  . Smokeless tobacco: Never Used  . Alcohol use No   Allergies  Allergen Reactions  . Chlorthalidone Other (See Comments)    Leg cramps   . Ciprofloxacin Swelling    Swelling of hands and feet  . Codeine Nausea Only  . Diclofenac     Abdominal Pain  . Hydrochlorothiazide     Leg cramps  . Adhesive [Tape] Rash    IF LEFT ON FOR LONG PERIODS-PAPER TAPE OK TO USE  . Betadine [Povidone Iodine] Rash   Prior to Admission medications   Medication Sig Start Date End Date Taking? Authorizing Provider  apixaban (ELIQUIS) 5 MG TABS tablet Take 1 tablet (5 mg total) by mouth 2 (two) times  daily. 12/31/16  Yes Srikar Sudini, MD  atorvastatin (LIPITOR) 20 MG tablet Take 1 tablet by mouth at bedtime. 10/29/14  Yes Historical Provider, MD  CALCIUM PO Take 1 tablet by mouth daily.   Yes Historical Provider, MD  cetirizine (ZYRTEC) 10 MG tablet Take 1 tablet by mouth daily as needed.   Yes Historical Provider, MD  Cholecalciferol (D-5000) 5000 UNITS TABS Take 1 tablet by mouth daily.   Yes Historical Provider, MD  cyanocobalamin (V-R VITAMIN B-12) 500 MCG tablet Take 1 tablet by mouth daily.   Yes Historical Provider, MD  diltiazem (CARDIZEM CD) 120 MG 24 hr capsule Take 1 capsule (120 mg total) by mouth daily. 01/01/17  Yes Srikar Sudini, MD  furosemide (LASIX) 40 MG tablet Take 1 tablet (40 mg total) by mouth daily. 01/01/17  Yes Srikar Sudini, MD  HYDROcodone-acetaminophen (NORCO/VICODIN) 5-325 MG tablet TK 1 T PO  Q 6 H PRF MODERATE PAIN 10/01/15  Yes Historical Provider, MD  insulin NPH-regular Human (NOVOLIN 70/30) (70-30) 100 UNIT/ML injection Inject 20-40 Units into the skin 2 (two) times daily. 20 units in am and up to 40 units qhs depending on her bs 10/29/14  Yes Historical Provider, MD  letrozole (FEMARA) 2.5 MG tablet Take 1 tablet (2.5 mg total) by mouth daily. 01/01/16  Yes Lloyd Huger, MD  metoprolol (LOPRESSOR) 50 MG tablet Take 1 tablet (50 mg total) by mouth 2 (two) times daily. 12/31/16  Yes Srikar Sudini, MD  Multiple Vitamin (MULTIVITAMIN) capsule Take 1 capsule by mouth daily.   Yes Historical Provider, MD  naproxen sodium (RA NAPROXEN SODIUM) 220 MG tablet Take 1 tablet by mouth 2 (two) times daily.   Yes Historical Provider, MD  omeprazole (PRILOSEC) 20 MG capsule Take 1 capsule by mouth every morning.  10/29/14  Yes Historical Provider, MD  traZODone (DESYREL) 150 MG tablet Take 1 tablet by mouth at bedtime. 10/29/14  Yes Historical Provider, MD   Review of Systems  Constitutional: Negative for appetite change and fatigue.  HENT: Negative for congestion, postnasal drip and  sore throat.   Eyes: Negative.   Respiratory: Negative for cough, chest tightness and shortness of breath.   Cardiovascular: Negative for chest pain, palpitations and leg swelling.  Gastrointestinal: Negative for abdominal distention and abdominal pain.  Endocrine: Negative.   Genitourinary: Negative.   Musculoskeletal: Negative for back pain and neck pain.  Skin: Negative.   Allergic/Immunologic: Negative.   Neurological: Negative for dizziness and light-headedness.  Hematological: Negative for adenopathy. Bruises/bleeds easily.  Psychiatric/Behavioral: Positive for sleep disturbance (wakes up frequently; wearing CPAP nightly). Negative for dysphoric mood and suicidal ideas. The patient is not nervous/anxious.    Vitals:   02/07/17  1033  BP: (!) 121/56  Pulse: (!) 52  Resp: 20  SpO2: 99%  Weight: 210 lb 6 oz (95.4 kg)  Height: 5\' 6"  (1.676 m)   Wt Readings from Last 3 Encounters:  02/07/17 210 lb 6 oz (95.4 kg)  02/03/17 213 lb (96.6 kg)  01/14/17 213 lb 11.8 oz (97 kg)   Lab Results  Component Value Date   CREATININE 0.87 12/30/2016   CREATININE 0.76 12/28/2016   CREATININE 0.64 12/27/2016   Physical Exam  Constitutional: She is oriented to person, place, and time. She appears well-developed and well-nourished.  HENT:  Head: Normocephalic and atraumatic.  Neck: Normal range of motion. Neck supple. No JVD present.  Cardiovascular: Normal rate and regular rhythm.   Pulmonary/Chest: Effort normal. She has no wheezes. She has no rales.  Abdominal: Soft. She exhibits no distension. There is no tenderness.  Musculoskeletal: She exhibits no edema or tenderness.  Neurological: She is alert and oriented to person, place, and time.  Skin: Skin is warm and dry.  Psychiatric: She has a normal mood and affect. Her behavior is normal. Thought content normal.  Nursing note and vitals reviewed.   Assessment & Plan:  1: Chronic Heart Failure with preserved ejection fraction- -  NYHA class I - euvolemic - weight down 5 pounds since she was last here. Instructed to call for an overnight weight gain of >2 pounds or a weekly weight gain of >5 pounds - not adding salt and trying to follow a 2000mg  sodium diet - continue furosemide, metoprolol - saw cardiologist Ubaldo Glassing) 01/27/17 - Reviewed BMP drawn on 12/30/16 shows potassium 3.7 and GFR >60 - reports cramps around ankles; can try OTC magnesium  2: Atrial fibrillation- - cardioverted on 02/03/17 and reports feeling well ever since - continue apixaban, diltiazem and metoprolol   Medication bottles were reviewed.  Return here as needed or for any questions/problems.

## 2017-02-07 NOTE — Patient Instructions (Signed)
Continue weighing daily and call for an overnight weight gain of > 2 pounds or a weekly weight gain of >5 pounds. 

## 2017-06-06 ENCOUNTER — Ambulatory Visit
Admission: RE | Admit: 2017-06-06 | Discharge: 2017-06-06 | Disposition: A | Discharge status: Home / Self Care | Payer: Medicare Other | Source: Ambulatory Visit | Attending: Oncology | Admitting: Oncology

## 2017-06-06 DIAGNOSIS — Z79899 Other long term (current) drug therapy: Secondary | ICD-10-CM | POA: Diagnosis not present

## 2017-06-06 DIAGNOSIS — Z780 Asymptomatic menopausal state: Secondary | ICD-10-CM | POA: Diagnosis present

## 2017-06-06 DIAGNOSIS — M85851 Other specified disorders of bone density and structure, right thigh: Secondary | ICD-10-CM | POA: Diagnosis not present

## 2017-06-06 DIAGNOSIS — C50212 Malignant neoplasm of upper-inner quadrant of left female breast: Secondary | ICD-10-CM

## 2017-06-06 DIAGNOSIS — Z853 Personal history of malignant neoplasm of breast: Secondary | ICD-10-CM | POA: Diagnosis not present

## 2017-06-06 DIAGNOSIS — Z170 Estrogen receptor positive status [ER+]: Principal | ICD-10-CM

## 2017-06-06 DIAGNOSIS — Z08 Encounter for follow-up examination after completed treatment for malignant neoplasm: Secondary | ICD-10-CM | POA: Insufficient documentation

## 2017-07-12 NOTE — Progress Notes (Signed)
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  INTERVAL HISTORY: Patient returns to clinic today for routine 6 month evaluation. She continues to tolerate letrozole well without significant side effects. She currently feels well and is asymptomatic. She has no neurologic complaints. She denies any recent fevers. She has no chest pain, cough or shortness of breath. She denies any nausea, vomiting, constipation, or diarrhea. She has no urinary complaints. Patient offers no specific complaints today.  REVIEW OF SYSTEMS:   Review of Systems  Constitutional: Negative for fever, malaise/fatigue and weight loss.  Respiratory: Negative.  Negative for cough and shortness of breath.   Cardiovascular: Negative.  Negative for chest pain and leg swelling.  Gastrointestinal: Negative.  Negative for abdominal pain.  Genitourinary: Negative.   Musculoskeletal: Negative.   Skin: Negative.  Negative for rash.  Neurological: Negative.  Negative for sensory change and weakness.  Psychiatric/Behavioral: Negative.  The patient is not nervous/anxious.     As per HPI. Otherwise, a complete review of systems is negative.  PAST MEDICAL HISTORY: Past Medical History:  Diagnosis Date  . Abnormal finding on mammography 11/21/2012   Overview:  Biopsied 11/25/2012 by Dr. Pat Patrick; was due for screening mammogram February 2015.   Marland Kitchen Acid reflux 03/31/2014  . Anemia    h/o  . Apnea, sleep 03/31/2014   Overview:  on CPAP   . B12 deficiency 04/01/2014  . BP (high blood pressure) 03/31/2014  . Breast cancer ([place]) 06/2015   left breast invasive mammary carcinoma  . Cholelithiasis 05/27/2015   Requiring Cholecystectomy 08/14/2014- Dr. Pat Patrick   .  Chronic LBP 05/02/2015  . Clinical depression 03/31/2014   Overview:  S/P suicide attempt 02/1995   . COPD (chronic obstructive pulmonary disease) ([place])    per cxr on 07-10-15  . Diabetes mellitus, type 2 ([place]) 03/31/2014  . Dysrhythmia    h/o fluttering occ  . HLD (hyperlipidemia) 03/31/2014  . Insomnia, persistent 04/01/2014  . Obesity 05/27/2015  . Personal history of radiation therapy    left breast ca  . 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 ABDOMINAL HYSTERECTOMY     Total  . BACK SURGERY     Multiple  . BREAST BIOPSY Left 12/11/12   demonstrating fibrocystic change, sclerosing adenosis,   . BREAST BIOPSY Left 06/04/2015   invasive mammary carcinoma  . BREAST EXCISIONAL BIOPSY Right 06/20/09   US guided needle loc. Benign  . BREAST LUMPECTOMY Left 07/18/2015   clear margins  . BREAST LUMPECTOMY WITH SENTINEL LYMPH NODE BIOPSY Left 07/18/2015   Procedure: BREAST LUMPECTOMY WITH SENTINEL LYMPH NODE BX;  Surgeon: Hubbard Robinson, MD;  Location: [facility] ORS;  Service: General;  Laterality: Left;  . CARDIOVERSION N/A 02/03/2017   Procedure: CARDIOVERSION;  Surgeon: Teodoro Spray, MD;  Location: [facility] ORS;  Service: Cardiovascular;  Laterality: N/A;  . CARPAL TUNNEL RELEASE Right   . DEBRIDEMENT AND CLOSURE WOUND Left 10/01/2015   Procedure: DEBRIDEMENT AND CLOSURE WOUND;  Surgeon: Clayburn Pert, MD;  Location: [facility] OR[facility];  Service: General;  Laterality: Left;  . KNEE SURGERY Left   . LAPAROSCOPIC CHOLECYSTECTOMY  08/14/2010   Dr. Pat Patrick  . NECK SURGERY  Multiple  . SENTINEL NODE BIOPSY Left 07/18/2015   Procedure: SENTINEL NODE BIOPSY/NEEDLE LOC;  Surgeon: Hubbard Robinson, MD;  Location: [facility] OR[facility];  Service: General;  Laterality: Left;  . ULNAR NERVE REPAIR      FAMILY HISTORY Family History  Problem Relation Age of Onset  . Hypertension Unknown   . Stroke Unknown   . Diabetes Unknown   . Heart disease  Unknown   . Hypertension Mother   . Diabetes Brother   . Breast cancer Neg Hx        ADVANCED DIRECTIVES:    HEALTH MAINTENANCE: Social History  Substance Use Topics  . Smoking status: Former Smoker    Packs/day: 1.00    Years: 40.00    Types: Cigarettes    Quit date: 09/07/2015  . Smokeless tobacco: Never Used  . Alcohol use No     Colonoscopy:  PAP:  Bone density:  Lipid panel:  Allergies  Allergen Reactions  . Chlorthalidone Other (See Comments)    Leg cramps   . Ciprofloxacin Swelling    Swelling of hands and feet  . Codeine Nausea Only  . Diclofenac     Abdominal Pain  . Hydrochlorothiazide     Leg cramps  . Adhesive [Tape] Rash    IF LEFT ON FOR LONG PERIODS-PAPER TAPE OK TO USE  . Betadine [Povidone Iodine] Rash    Current Outpatient Prescriptions  Medication Sig Dispense Refill  . acetaminophen (TYLENOL) 500 MG tablet Take 1,000 mg by mouth daily as needed for moderate pain or headache.    Marland Kitchen atorvastatin (LIPITOR) 20 MG tablet Take 20 mg by mouth at bedtime.     . Calcium Carbonate (CALCIUM 600 PO) Take 600 mg by mouth daily.    . Cholecalciferol (D-5000) 5000 UNITS TABS Take 5,000 Units by mouth daily.     . cyanocobalamin (V-R VITAMIN B-12) 500 MCG tablet Take 500 mcg by mouth daily.     Marland Kitchen diltiazem (CARDIZEM CD) 120 MG 24 hr capsule Take 1 capsule (120 mg total) by mouth daily. 30 capsule 0  . fexofenadine (ALLEGRA) 180 MG tablet Take 180 mg by mouth daily.    . furosemide (LASIX) 40 MG tablet Take 1 tablet (40 mg total) by mouth daily. 30 tablet 0  . hydrocortisone cream 1 % Apply 1 application topically as needed for itching.    . insulin NPH-regular Human (NOVOLIN 70/30) (70-30) 100 UNIT/ML injection Inject 20-40 Units into the skin 2 (two) times daily. Inject 20 units in the morning and 40 units at night    . letrozole (FEMARA) 2.5 MG tablet Take 1 tablet (2.5 mg total) by mouth daily. (Patient taking differently: Take 2.5 mg by mouth at bedtime.  ) 30 tablet 11  . metoprolol (LOPRESSOR) 50 MG tablet Take 1 tablet (50 mg total) by mouth 2 (two) times daily. 60 tablet 0  . Multiple Vitamin (MULTIVITAMIN) capsule Take 1 capsule by mouth daily.    Marland Kitchen omeprazole (PRILOSEC) 20 MG capsule Take 20 mg by mouth daily.     . traZODone (DESYREL) 150 MG tablet Take 150 mg by mouth at bedtime.     Marland Kitchen warfarin (COUMADIN) 5 MG tablet Take 5 mg by mouth daily at 6 PM.    . meloxicam (MOBIC) 7.5 MG tablet Take 7.5 mg by mouth daily.      No current facility-administered medications for this visit.     OBJECTIVE: Vitals:   07/15/17 1111  BP: 137/79  Pulse: 61  Resp: 20  Temp: (!) 96.4 F (35.8 C)     Body mass index is 35.58 kg/m.    ECOG FS:0 - Asymptomatic  General: Well-developed, well-nourished, no acute distress. Eyes: Pink conjunctiva, anicteric sclera. Breasts: Patient declined exam today.  Lungs: Clear to auscultation bilaterally. Heart: Regular rate and rhythm. No rubs, murmurs, or gallops. Abdomen: Soft, nontender, nondistended. No organomegaly noted, normoactive bowel sounds. Musculoskeletal: No edema, cyanosis, or clubbing. Neuro: Alert, answering all questions appropriately. Cranial nerves grossly intact. Skin: No rashes or petechiae noted. Psych: Normal affect.   LAB RESULTS:  Lab Results  Component Value Date   NA 141 12/30/2016   K 3.7 12/30/2016   CL 102 12/30/2016   CO2 32 12/30/2016   GLUCOSE 96 12/30/2016   BUN 25 (H) 12/30/2016   CREATININE 0.87 12/30/2016   CALCIUM 8.9 12/30/2016   PROT 7.3 07/10/2015   ALBUMIN 3.6 07/10/2015   AST 17 07/10/2015   ALT 16 07/10/2015   ALKPHOS 62 07/10/2015   BILITOT 0.6 07/10/2015   GFRNONAA >60 12/30/2016   GFRAA >60 12/30/2016    Lab Results  Component Value Date   WBC 11.3 (H) 12/31/2016   NEUTROABS 9.7 (H) 12/27/2016   HGB 13.5 12/31/2016   HCT 40.5 12/31/2016   MCV 96.0 12/31/2016   PLT 226 12/31/2016     STUDIES: No results found.  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  PLAN:    1. 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 Patient had low risk Oncotype score, therefore did not require adjuvant chemotherapy. Continue letrozole daily for 5 years completing in March 2022. Her most recent mammogram on June 06, 2017 was reported as BI-RADS 2, repeat in September 2019. Return to clinic in 6 months for routine evaluation. 2. Osteopenia: Bone marrow density on June 06, 2017 reported T score of -1.6. This is slightly worse than 1 year prior, therefore patient was instructed to increase her calcium and vitamin D supplementation. Repeat in September 2019.  Patient will return to clinic Patient expressed understanding and was in agreement with this plan. She also understands that She can call clinic at any time with any questions, concerns, or complaints.    Lloyd Huger, MD   07/15/2017 2:49 PM

## 2017-07-15 ENCOUNTER — Inpatient Hospital Stay: Payer: Medicare Other | Attending: Oncology | Admitting: Oncology

## 2017-07-15 VITALS — BP 137/79 | HR 61 | Temp 96.4°F | Resp 20 | Wt 220.5 lb

## 2017-07-15 DIAGNOSIS — Z915 Personal history of self-harm: Secondary | ICD-10-CM | POA: Diagnosis not present

## 2017-07-15 DIAGNOSIS — E785 Hyperlipidemia, unspecified: Secondary | ICD-10-CM | POA: Diagnosis not present

## 2017-07-15 DIAGNOSIS — G4700 Insomnia, unspecified: Secondary | ICD-10-CM | POA: Insufficient documentation

## 2017-07-15 DIAGNOSIS — Z923 Personal history of irradiation: Secondary | ICD-10-CM | POA: Diagnosis not present

## 2017-07-15 DIAGNOSIS — Z7901 Long term (current) use of anticoagulants: Secondary | ICD-10-CM | POA: Diagnosis not present

## 2017-07-15 DIAGNOSIS — E669 Obesity, unspecified: Secondary | ICD-10-CM | POA: Diagnosis not present

## 2017-07-15 DIAGNOSIS — M8580 Other specified disorders of bone density and structure, unspecified site: Secondary | ICD-10-CM | POA: Insufficient documentation

## 2017-07-15 DIAGNOSIS — C50212 Malignant neoplasm of upper-inner quadrant of left female breast: Secondary | ICD-10-CM | POA: Diagnosis present

## 2017-07-15 DIAGNOSIS — E538 Deficiency of other specified B group vitamins: Secondary | ICD-10-CM | POA: Diagnosis not present

## 2017-07-15 DIAGNOSIS — E119 Type 2 diabetes mellitus without complications: Secondary | ICD-10-CM | POA: Diagnosis not present

## 2017-07-15 DIAGNOSIS — F329 Major depressive disorder, single episode, unspecified: Secondary | ICD-10-CM | POA: Diagnosis not present

## 2017-07-15 DIAGNOSIS — Z794 Long term (current) use of insulin: Secondary | ICD-10-CM | POA: Diagnosis not present

## 2017-07-15 DIAGNOSIS — J449 Chronic obstructive pulmonary disease, unspecified: Secondary | ICD-10-CM | POA: Diagnosis not present

## 2017-07-15 DIAGNOSIS — Z170 Estrogen receptor positive status [ER+]: Secondary | ICD-10-CM | POA: Diagnosis not present

## 2017-07-15 DIAGNOSIS — K219 Gastro-esophageal reflux disease without esophagitis: Secondary | ICD-10-CM

## 2017-07-15 DIAGNOSIS — Z79811 Long term (current) use of aromatase inhibitors: Secondary | ICD-10-CM | POA: Insufficient documentation

## 2017-07-15 DIAGNOSIS — Z79899 Other long term (current) drug therapy: Secondary | ICD-10-CM | POA: Insufficient documentation

## 2017-07-15 DIAGNOSIS — Z87891 Personal history of nicotine dependence: Secondary | ICD-10-CM | POA: Diagnosis not present

## 2017-07-15 NOTE — Progress Notes (Signed)
Patient denies any concerns today.  

## 2018-01-03 ENCOUNTER — Other Ambulatory Visit: Payer: Self-pay | Admitting: Oncology

## 2018-01-03 DIAGNOSIS — Z170 Estrogen receptor positive status [ER+]: Secondary | ICD-10-CM

## 2018-01-03 DIAGNOSIS — Z780 Asymptomatic menopausal state: Secondary | ICD-10-CM

## 2018-01-03 DIAGNOSIS — C50212 Malignant neoplasm of upper-inner quadrant of left female breast: Secondary | ICD-10-CM

## 2018-01-09 NOTE — Progress Notes (Signed)
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  INTERVAL HISTORY: Patient returns to clinic today for routine six-month evaluation.  She is tolerating letrozole well without side effects.  She continues to feel well and is asymptomatic. She has no neurologic complaints.  She denies any recent fevers or illnesses.  She has no chest pain, cough or shortness of breath. She denies any nausea, vomiting, constipation, or diarrhea. She has no urinary complaints. Patient offers no specific complaints today.  REVIEW OF SYSTEMS:   Review of Systems  Constitutional: Negative.  Negative for fever, malaise/fatigue and weight loss.  Respiratory: Negative.  Negative for cough and shortness of breath.   Cardiovascular: Negative.  Negative for chest pain and leg swelling.  Gastrointestinal: Negative.  Negative for abdominal pain.  Genitourinary: Negative.   Musculoskeletal: Negative.   Skin: Negative.  Negative for rash.  Neurological: Negative.  Negative for sensory change, focal weakness and weakness.  Psychiatric/Behavioral: Negative.  The patient is not nervous/anxious.     As per HPI. Otherwise, a complete review of systems is negative.  PAST MEDICAL HISTORY: Past Medical History:  Diagnosis Date  . Abnormal finding on mammography 11/21/2012   Overview:  Biopsied 11/25/2012 by Dr. Pat Patrick; was due for screening mammogram February 2015.   Marland Kitchen Acid reflux 03/31/2014  . Anemia    h/o  . Apnea, sleep 03/31/2014   Overview:  on CPAP   . B12 deficiency 04/01/2014  . BP (high blood pressure) 03/31/2014  . Breast cancer ([place]) 06/2015   left breast invasive mammary carcinoma  . Cholelithiasis 05/27/2015   Requiring  Cholecystectomy 08/14/2014- Dr. Pat Patrick   . Chronic LBP 05/02/2015  . Clinical depression 03/31/2014   Overview:  S/P suicide attempt 02/1995   . COPD (chronic obstructive pulmonary disease) ([place])    per cxr on 07-10-15  . Diabetes mellitus, type 2 ([place]) 03/31/2014  . Dysrhythmia    h/o fluttering occ  . HLD (hyperlipidemia) 03/31/2014  . Insomnia, persistent 04/01/2014  . Obesity 05/27/2015  . Personal history of radiation therapy    left breast ca  . 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 ABDOMINAL HYSTERECTOMY     Total  . BACK SURGERY     Multiple  . BREAST BIOPSY Left 12/11/12   demonstrating fibrocystic change, sclerosing adenosis,   . BREAST BIOPSY Left 06/04/2015   invasive mammary carcinoma  . BREAST EXCISIONAL BIOPSY Right 06/20/09   US guided needle loc. Benign  . BREAST LUMPECTOMY Left 07/18/2015   clear margins  . BREAST LUMPECTOMY WITH SENTINEL LYMPH NODE BIOPSY Left 07/18/2015   Procedure: BREAST LUMPECTOMY WITH SENTINEL LYMPH NODE BX;  Surgeon: Hubbard Robinson, MD;  Location: [facility];  Service: General;  Laterality: Left;  . CARDIOVERSION N/A 02/03/2017   Procedure: CARDIOVERSION;  Surgeon: Teodoro Spray, MD;  Location: [facility];  Service: Cardiovascular;  Laterality: N/A;  . CARPAL TUNNEL RELEASE Right   . DEBRIDEMENT AND CLOSURE WOUND Left 10/01/2015   Procedure: DEBRIDEMENT AND CLOSURE WOUND;  Surgeon: Clayburn Pert, MD;  Location: [facility] OR[facility];  Service: General;  Laterality: Left;  . KNEE SURGERY Left   . LAPAROSCOPIC CHOLECYSTECTOMY  08/14/2010  Dr. Pat Patrick  . NECK SURGERY     Multiple  . SENTINEL NODE BIOPSY Left 07/18/2015   Procedure: SENTINEL NODE BIOPSY/NEEDLE LOC;  Surgeon: Hubbard Robinson, MD;  Location: [facility];  Service: General;  Laterality: Left;  . ULNAR NERVE REPAIR      FAMILY HISTORY Family History  Problem Relation Age of Onset  . Hypertension Unknown   . Stroke Unknown   .  Diabetes Unknown   . Heart disease Unknown   . Hypertension Mother   . Diabetes Brother   . Breast cancer Neg Hx        ADVANCED DIRECTIVES:    HEALTH MAINTENANCE: Social History   Tobacco Use  . Smoking status: Former Smoker    Packs/day: 1.00    Years: 40.00    Pack years: 40.00    Types: Cigarettes    Last attempt to quit: 09/07/2015    Years since quitting: 2.3  . Smokeless tobacco: Never Used  Substance Use Topics  . Alcohol use: No    Alcohol/week: 0.0 oz  . Drug use: No     Colonoscopy:  PAP:  Bone density:  Lipid panel:  Allergies  Allergen Reactions  . Chlorthalidone Other (See Comments)    Leg cramps   . Ciprofloxacin Swelling    Swelling of hands and feet  . Codeine Nausea Only  . Diclofenac     Abdominal Pain  . Hydrochlorothiazide     Leg cramps  . Adhesive [Tape] Rash    IF LEFT ON FOR LONG PERIODS-PAPER TAPE OK TO USE  . Betadine [Povidone Iodine] Rash    Current Outpatient Medications  Medication Sig Dispense Refill  . acetaminophen (TYLENOL) 500 MG tablet Take 1,000 mg by mouth daily as needed for moderate pain or headache.    Marland Kitchen atorvastatin (LIPITOR) 20 MG tablet Take 20 mg by mouth at bedtime.     . Calcium Carbonate (CALCIUM 600 PO) Take 600 mg by mouth daily.    . Cholecalciferol (D-5000) 5000 UNITS TABS Take 5,000 Units by mouth daily.     . cyanocobalamin (V-R VITAMIN B-12) 500 MCG tablet Take 500 mcg by mouth daily.     Marland Kitchen diltiazem (CARDIZEM CD) 120 MG 24 hr capsule Take 1 capsule (120 mg total) by mouth daily. 30 capsule 0  . fexofenadine (ALLEGRA) 180 MG tablet Take 180 mg by mouth daily.    . furosemide (LASIX) 40 MG tablet Take 1 tablet (40 mg total) by mouth daily. 30 tablet 0  . hydrocortisone cream 1 % Apply 1 application topically as needed for itching.    . insulin NPH-regular Human (NOVOLIN 70/30) (70-30) 100 UNIT/ML injection Inject 20-40 Units into the skin 2 (two) times daily. Inject 20 units in the morning and 40 units  at night    . letrozole (FEMARA) 2.5 MG tablet TAKE 1 TABLET BY MOUTH EVERY DAY 30 tablet 0  . metoprolol (LOPRESSOR) 50 MG tablet Take 1 tablet (50 mg total) by mouth 2 (two) times daily. 60 tablet 0  . Multiple Vitamin (MULTIVITAMIN) capsule Take 1 capsule by mouth daily.    Marland Kitchen omeprazole (PRILOSEC) 20 MG capsule Take 20 mg by mouth daily.     . traZODone (DESYREL) 150 MG tablet Take 150 mg by mouth at bedtime.     Marland Kitchen warfarin (COUMADIN) 2 MG tablet Take 2 tablets by mouth daily.     No current facility-administered medications for this visit.     OBJECTIVE: Vitals:  01/13/18 1105  BP: (!) 92/56  Pulse: 79  Resp: 20  Temp: 98.4 F (36.9 C)     Body mass index is 35.58 kg/m.    ECOG FS:0 - Asymptomatic  General: Well-developed, well-nourished, no acute distress. Eyes: Pink conjunctiva, anicteric sclera. Breast: Bilateral breast and axilla without lumps or masses. Lungs: Clear to auscultation bilaterally. Heart: Regular rate and rhythm. No rubs, murmurs, or gallops. Abdomen: Soft, nontender, nondistended. No organomegaly noted, normoactive bowel sounds. Musculoskeletal: No edema, cyanosis, or clubbing. Neuro: Alert, answering all questions appropriately. Cranial nerves grossly intact. Skin: No rashes or petechiae noted. Psych: Normal affect.   LAB RESULTS:  Lab Results  Component Value Date   NA 141 12/30/2016   K 3.7 12/30/2016   CL 102 12/30/2016   CO2 32 12/30/2016   GLUCOSE 96 12/30/2016   BUN 25 (H) 12/30/2016   CREATININE 0.87 12/30/2016   CALCIUM 8.9 12/30/2016   PROT 7.3 07/10/2015   ALBUMIN 3.6 07/10/2015   AST 17 07/10/2015   ALT 16 07/10/2015   ALKPHOS 62 07/10/2015   BILITOT 0.6 07/10/2015   GFRNONAA >60 12/30/2016   GFRAA >60 12/30/2016    Lab Results  Component Value Date   WBC 11.3 (H) 12/31/2016   NEUTROABS 9.7 (H) 12/27/2016   HGB 13.5 12/31/2016   HCT 40.5 12/31/2016   MCV 96.0 12/31/2016   PLT 226 12/31/2016     STUDIES: No results  found.  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  PLAN:    1. 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  Patient had low risk Oncotype score, therefore did not require adjuvant chemotherapy.  She completed adjuvant XRT in March 2017.  Continue letrozole daily completing 5 years of treatment in March 2022.  Her most recent mammogram on June 06, 2017 was reported as BI-RADS 2, repeat in September 2019. Return to clinic in 6 months for routine evaluation. 2. Osteopenia: Bone marrow density on June 06, 2017 reported T score of -1.6. This is slightly worse than 1 year prior.  Continue calcium and vitamin D supplementation.  Repeat along with mammogram in September 2019.  Approximately 20 minutes was spent in discussion of which current 50% was consultation.  Patient will return to clinic Patient expressed understanding and was in agreement with this plan. She also understands that She can call clinic at any time with any questions, concerns, or complaints.    Lloyd Huger, MD   01/13/2018 3:14 PM

## 2018-01-13 ENCOUNTER — Encounter: Payer: Self-pay | Admitting: Oncology

## 2018-01-13 ENCOUNTER — Inpatient Hospital Stay: Payer: Medicare Other | Attending: Oncology | Admitting: Oncology

## 2018-01-13 ENCOUNTER — Other Ambulatory Visit: Payer: Self-pay

## 2018-01-13 VITALS — BP 92/56 | HR 79 | Temp 98.4°F | Resp 20 | Ht 66.0 in | Wt 220.5 lb

## 2018-01-13 DIAGNOSIS — E119 Type 2 diabetes mellitus without complications: Secondary | ICD-10-CM | POA: Diagnosis not present

## 2018-01-13 DIAGNOSIS — F329 Major depressive disorder, single episode, unspecified: Secondary | ICD-10-CM | POA: Insufficient documentation

## 2018-01-13 DIAGNOSIS — Z794 Long term (current) use of insulin: Secondary | ICD-10-CM | POA: Insufficient documentation

## 2018-01-13 DIAGNOSIS — Z79899 Other long term (current) drug therapy: Secondary | ICD-10-CM | POA: Diagnosis not present

## 2018-01-13 DIAGNOSIS — J449 Chronic obstructive pulmonary disease, unspecified: Secondary | ICD-10-CM | POA: Insufficient documentation

## 2018-01-13 DIAGNOSIS — C50212 Malignant neoplasm of upper-inner quadrant of left female breast: Secondary | ICD-10-CM

## 2018-01-13 DIAGNOSIS — Z87891 Personal history of nicotine dependence: Secondary | ICD-10-CM

## 2018-01-13 DIAGNOSIS — Z170 Estrogen receptor positive status [ER+]: Secondary | ICD-10-CM | POA: Diagnosis not present

## 2018-01-13 DIAGNOSIS — M8580 Other specified disorders of bone density and structure, unspecified site: Secondary | ICD-10-CM | POA: Diagnosis not present

## 2018-01-13 DIAGNOSIS — E538 Deficiency of other specified B group vitamins: Secondary | ICD-10-CM | POA: Insufficient documentation

## 2018-01-13 DIAGNOSIS — Z923 Personal history of irradiation: Secondary | ICD-10-CM | POA: Insufficient documentation

## 2018-01-13 DIAGNOSIS — E669 Obesity, unspecified: Secondary | ICD-10-CM | POA: Diagnosis not present

## 2018-01-13 DIAGNOSIS — E785 Hyperlipidemia, unspecified: Secondary | ICD-10-CM | POA: Insufficient documentation

## 2018-01-13 DIAGNOSIS — G4700 Insomnia, unspecified: Secondary | ICD-10-CM | POA: Diagnosis not present

## 2018-01-13 DIAGNOSIS — Z79811 Long term (current) use of aromatase inhibitors: Secondary | ICD-10-CM | POA: Diagnosis not present

## 2018-01-13 DIAGNOSIS — K219 Gastro-esophageal reflux disease without esophagitis: Secondary | ICD-10-CM | POA: Insufficient documentation

## 2018-01-13 DIAGNOSIS — Z7901 Long term (current) use of anticoagulants: Secondary | ICD-10-CM | POA: Diagnosis not present

## 2018-02-01 ENCOUNTER — Other Ambulatory Visit: Payer: Self-pay | Admitting: Oncology

## 2018-02-01 DIAGNOSIS — C50212 Malignant neoplasm of upper-inner quadrant of left female breast: Secondary | ICD-10-CM

## 2018-02-01 DIAGNOSIS — Z780 Asymptomatic menopausal state: Secondary | ICD-10-CM

## 2018-02-01 DIAGNOSIS — Z170 Estrogen receptor positive status [ER+]: Secondary | ICD-10-CM

## 2018-03-14 ENCOUNTER — Other Ambulatory Visit: Payer: Self-pay | Admitting: Oncology

## 2018-03-14 DIAGNOSIS — Z780 Asymptomatic menopausal state: Secondary | ICD-10-CM

## 2018-03-14 DIAGNOSIS — Z170 Estrogen receptor positive status [ER+]: Secondary | ICD-10-CM

## 2018-03-14 DIAGNOSIS — C50212 Malignant neoplasm of upper-inner quadrant of left female breast: Secondary | ICD-10-CM

## 2018-04-13 ENCOUNTER — Other Ambulatory Visit: Payer: Self-pay | Admitting: Oncology

## 2018-04-13 DIAGNOSIS — Z780 Asymptomatic menopausal state: Secondary | ICD-10-CM

## 2018-04-13 DIAGNOSIS — C50212 Malignant neoplasm of upper-inner quadrant of left female breast: Secondary | ICD-10-CM

## 2018-04-13 DIAGNOSIS — Z170 Estrogen receptor positive status [ER+]: Secondary | ICD-10-CM

## 2018-05-28 DEATH — deceased

## 2018-06-12 ENCOUNTER — Other Ambulatory Visit: Payer: Medicare Other

## 2018-07-09 NOTE — Progress Notes (Deleted)
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  INTERVAL HISTORY: Patient returns to clinic today for routine six-month evaluation.  She is tolerating letrozole well without side effects.  She continues to feel well and is asymptomatic. She has no neurologic complaints.  She denies any recent fevers or illnesses.  She has no chest pain, cough or shortness of breath. She denies any nausea, vomiting, constipation, or diarrhea. She has no urinary complaints. Patient offers no specific complaints today.  REVIEW OF SYSTEMS:   Review of Systems  Constitutional: Negative.  Negative for fever, malaise/fatigue and weight loss.  Respiratory: Negative.  Negative for cough and shortness of breath.   Cardiovascular: Negative.  Negative for chest pain and leg swelling.  Gastrointestinal: Negative.  Negative for abdominal pain.  Genitourinary: Negative.   Musculoskeletal: Negative.   Skin: Negative.  Negative for rash.  Neurological: Negative.  Negative for sensory change, focal weakness and weakness.  Psychiatric/Behavioral: Negative.  The patient is not nervous/anxious.     As per HPI. Otherwise, a complete review of systems is negative.  PAST MEDICAL HISTORY: Past Medical History:  Diagnosis Date  . Abnormal finding on mammography 11/21/2012   Overview:  Biopsied 11/25/2012 by Dr. Pat Patrick; was due for screening mammogram February 2015.   Marland Kitchen Acid reflux 03/31/2014  . Anemia    h/o  . Apnea, sleep 03/31/2014   Overview:  on CPAP   . B12 deficiency 04/01/2014  . BP (high blood pressure) 03/31/2014  . Breast cancer ([place]) 06/2015   left breast invasive mammary carcinoma  . Cholelithiasis 05/27/2015   Requiring  Cholecystectomy 08/14/2014- Dr. Pat Patrick   . Chronic LBP 05/02/2015  . Clinical depression 03/31/2014   Overview:  S/P suicide attempt 02/1995   . COPD (chronic obstructive pulmonary disease) ([place])    per cxr on 07-10-15  . Diabetes mellitus, type 2 ([place]) 03/31/2014  . Dysrhythmia    h/o fluttering occ  . HLD (hyperlipidemia) 03/31/2014  . Insomnia, persistent 04/01/2014  . Obesity 05/27/2015  . Personal history of radiation therapy    left breast ca  . 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 ABDOMINAL HYSTERECTOMY     Total  . BACK SURGERY     Multiple  . BREAST BIOPSY Left 12/11/12   demonstrating fibrocystic change, sclerosing adenosis,   . BREAST BIOPSY Left 06/04/2015   invasive mammary carcinoma  . BREAST EXCISIONAL BIOPSY Right 06/20/09   US guided needle loc. Benign  . BREAST LUMPECTOMY Left 07/18/2015   clear margins  . BREAST LUMPECTOMY WITH SENTINEL LYMPH NODE BIOPSY Left 07/18/2015   Procedure: BREAST LUMPECTOMY WITH SENTINEL LYMPH NODE BX;  Surgeon: Hubbard Robinson, MD;  Location: [facility] ORS;  Service: General;  Laterality: Left;  . CARDIOVERSION N/A 02/03/2017   Procedure: CARDIOVERSION;  Surgeon: Teodoro Spray, MD;  Location: [facility] OR[facility];  Service: Cardiovascular;  Laterality: N/A;  . CARPAL TUNNEL RELEASE Right   . DEBRIDEMENT AND CLOSURE WOUND Left 10/01/2015   Procedure: DEBRIDEMENT AND CLOSURE WOUND;  Surgeon: Clayburn Pert, MD;  Location: [facility] OR[facility];  Service: General;  Laterality: Left;  . KNEE SURGERY Left   . LAPAROSCOPIC CHOLECYSTECTOMY  08/14/2010  Dr. Pat Patrick  . NECK SURGERY     Multiple  . SENTINEL NODE BIOPSY Left 07/18/2015   Procedure: SENTINEL NODE BIOPSY/NEEDLE LOC;  Surgeon: Hubbard Robinson, MD;  Location: [facility] OR[facility];  Service: General;  Laterality: Left;  . ULNAR NERVE REPAIR      FAMILY HISTORY Family History  Problem Relation Age of Onset  . Hypertension Unknown   . Stroke Unknown   .  Diabetes Unknown   . Heart disease Unknown   . Hypertension Mother   . Diabetes Brother   . Breast cancer Neg Hx        ADVANCED DIRECTIVES:    HEALTH MAINTENANCE: Social History   Tobacco Use  . Smoking status: Former Smoker    Packs/day: 1.00    Years: 40.00    Pack years: 40.00    Types: Cigarettes    Last attempt to quit: 09/07/2015    Years since quitting: 2.8  . Smokeless tobacco: Never Used  Substance Use Topics  . Alcohol use: No    Alcohol/week: 0.0 standard drinks  . Drug use: No     Colonoscopy:  PAP:  Bone density:  Lipid panel:  Allergies  Allergen Reactions  . Chlorthalidone Other (See Comments)    Leg cramps   . Ciprofloxacin Swelling    Swelling of hands and feet  . Codeine Nausea Only  . Diclofenac     Abdominal Pain  . Hydrochlorothiazide     Leg cramps  . Adhesive [Tape] Rash    IF LEFT ON FOR LONG PERIODS-PAPER TAPE OK TO USE  . Betadine [Povidone Iodine] Rash    Current Outpatient Medications  Medication Sig Dispense Refill  . acetaminophen (TYLENOL) 500 MG tablet Take 1,000 mg by mouth daily as needed for moderate pain or headache.    Marland Kitchen atorvastatin (LIPITOR) 20 MG tablet Take 20 mg by mouth at bedtime.     . Calcium Carbonate (CALCIUM 600 PO) Take 600 mg by mouth daily.    . Cholecalciferol (D-5000) 5000 UNITS TABS Take 5,000 Units by mouth daily.     . cyanocobalamin (V-R VITAMIN B-12) 500 MCG tablet Take 500 mcg by mouth daily.     Marland Kitchen diltiazem (CARDIZEM CD) 120 MG 24 hr capsule Take 1 capsule (120 mg total) by mouth daily. 30 capsule 0  . fexofenadine (ALLEGRA) 180 MG tablet Take 180 mg by mouth daily.    . furosemide (LASIX) 40 MG tablet Take 1 tablet (40 mg total) by mouth daily. 30 tablet 0  . hydrocortisone cream 1 % Apply 1 application topically as needed for itching.    . insulin NPH-regular Human (NOVOLIN 70/30) (70-30) 100 UNIT/ML injection Inject 20-40 Units into the skin 2 (two) times daily. Inject 20 units in the morning  and 40 units at night    . letrozole (FEMARA) 2.5 MG tablet TAKE 1 TABLET BY MOUTH EVERY DAY 90 tablet 2  . metoprolol (LOPRESSOR) 50 MG tablet Take 1 tablet (50 mg total) by mouth 2 (two) times daily. 60 tablet 0  . Multiple Vitamin (MULTIVITAMIN) capsule Take 1 capsule by mouth daily.    Marland Kitchen omeprazole (PRILOSEC) 20 MG capsule Take 20 mg by mouth daily.     . traZODone (DESYREL) 150 MG tablet Take 150 mg by mouth at bedtime.     Marland Kitchen warfarin (COUMADIN) 2 MG tablet Take 2 tablets by mouth daily.     No current facility-administered medications for this visit.     OBJECTIVE: There were  no vitals filed for this visit.   There is no height or weight on file to calculate BMI.    ECOG FS:0 - Asymptomatic  General: Well-developed, well-nourished, no acute distress. Eyes: Pink conjunctiva, anicteric sclera. Breast: Bilateral breast and axilla without lumps or masses. Lungs: Clear to auscultation bilaterally. Heart: Regular rate and rhythm. No rubs, murmurs, or gallops. Abdomen: Soft, nontender, nondistended. No organomegaly noted, normoactive bowel sounds. Musculoskeletal: No edema, cyanosis, or clubbing. Neuro: Alert, answering all questions appropriately. Cranial nerves grossly intact. Skin: No rashes or petechiae noted. Psych: Normal affect.   LAB RESULTS:  Lab Results  Component Value Date   NA 141 12/30/2016   K 3.7 12/30/2016   CL 102 12/30/2016   CO2 32 12/30/2016   GLUCOSE 96 12/30/2016   BUN 25 (H) 12/30/2016   CREATININE 0.87 12/30/2016   CALCIUM 8.9 12/30/2016   PROT 7.3 07/10/2015   ALBUMIN 3.6 07/10/2015   AST 17 07/10/2015   ALT 16 07/10/2015   ALKPHOS 62 07/10/2015   BILITOT 0.6 07/10/2015   GFRNONAA >60 12/30/2016   GFRAA >60 12/30/2016    Lab Results  Component Value Date   WBC 11.3 (H) 12/31/2016   NEUTROABS 9.7 (H) 12/27/2016   HGB 13.5 12/31/2016   HCT 40.5 12/31/2016   MCV 96.0 12/31/2016   PLT 226 12/31/2016     STUDIES: No results  found.  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  PLAN:    1. 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  Patient had low risk Oncotype score, therefore did not require adjuvant chemotherapy.  She completed adjuvant XRT in March 2017.  Continue letrozole daily completing 5 years of treatment in March 2022.  Her most recent mammogram on June 06, 2017 was reported as BI-RADS 2, repeat in September 2019. Return to clinic in 6 months for routine evaluation. 2. Osteopenia: Bone marrow density on June 06, 2017 reported T score of -1.6. This is slightly worse than 1 year prior.  Continue calcium and vitamin D supplementation.  Repeat along with mammogram in September 2019.  Approximately 20 minutes was spent in discussion of which current 50% was consultation.  Patient will return to clinic Patient expressed understanding and was in agreement with this plan. She also understands that She can call clinic at any time with any questions, concerns, or complaints.    Lloyd Huger, MD   07/09/2018 9:47 PM

## 2018-07-14 ENCOUNTER — Inpatient Hospital Stay: Payer: Self-pay | Attending: Oncology | Admitting: Oncology
